# Patient Record
Sex: Female | Born: 1960
Health system: Southern US, Community
[De-identification: ages and names within clinical notes are randomized; demographics above are authoritative.]

## PROBLEM LIST (undated history)

## (undated) DIAGNOSIS — I10 Essential (primary) hypertension: Secondary | ICD-10-CM

## (undated) DIAGNOSIS — E785 Hyperlipidemia, unspecified: Secondary | ICD-10-CM

## (undated) DIAGNOSIS — T7840XA Allergy, unspecified, initial encounter: Secondary | ICD-10-CM

## (undated) HISTORY — DX: Allergy, unspecified, initial encounter: T78.40XA

## (undated) HISTORY — DX: Essential (primary) hypertension: I10

## (undated) HISTORY — PX: TUBAL LIGATION: SHX77

## (undated) HISTORY — DX: Hyperlipidemia, unspecified: E78.5

---

## 2005-02-21 ENCOUNTER — Ambulatory Visit: Payer: Self-pay | Admitting: Family Medicine

## 2006-04-21 ENCOUNTER — Ambulatory Visit: Payer: Self-pay | Admitting: Family Medicine

## 2007-04-23 ENCOUNTER — Ambulatory Visit: Payer: Self-pay | Admitting: Family Medicine

## 2007-07-25 ENCOUNTER — Ambulatory Visit: Payer: Self-pay | Admitting: Internal Medicine

## 2008-01-20 ENCOUNTER — Ambulatory Visit: Payer: Self-pay | Admitting: Gastroenterology

## 2008-07-21 ENCOUNTER — Ambulatory Visit: Payer: Self-pay | Admitting: Family Medicine

## 2009-09-18 ENCOUNTER — Ambulatory Visit: Payer: Self-pay | Admitting: Family Medicine

## 2010-05-28 ENCOUNTER — Ambulatory Visit: Payer: Self-pay | Admitting: Family Medicine

## 2013-06-08 DIAGNOSIS — L989 Disorder of the skin and subcutaneous tissue, unspecified: Secondary | ICD-10-CM | POA: Insufficient documentation

## 2014-11-09 ENCOUNTER — Ambulatory Visit: Payer: Self-pay | Admitting: Family Medicine

## 2015-04-14 ENCOUNTER — Encounter: Payer: Self-pay | Admitting: Family Medicine

## 2015-04-14 ENCOUNTER — Ambulatory Visit (INDEPENDENT_AMBULATORY_CARE_PROVIDER_SITE_OTHER): Payer: BLUE CROSS/BLUE SHIELD | Admitting: Family Medicine

## 2015-04-14 ENCOUNTER — Other Ambulatory Visit: Payer: Self-pay

## 2015-04-14 VITALS — BP 140/80 | HR 72 | Ht 62.0 in | Wt 157.0 lb

## 2015-04-14 DIAGNOSIS — J301 Allergic rhinitis due to pollen: Secondary | ICD-10-CM | POA: Diagnosis not present

## 2015-04-14 DIAGNOSIS — E663 Overweight: Secondary | ICD-10-CM

## 2015-04-14 DIAGNOSIS — E785 Hyperlipidemia, unspecified: Secondary | ICD-10-CM | POA: Diagnosis not present

## 2015-04-14 DIAGNOSIS — F17219 Nicotine dependence, cigarettes, with unspecified nicotine-induced disorders: Secondary | ICD-10-CM

## 2015-04-14 DIAGNOSIS — E7849 Other hyperlipidemia: Secondary | ICD-10-CM | POA: Insufficient documentation

## 2015-04-14 DIAGNOSIS — F17210 Nicotine dependence, cigarettes, uncomplicated: Secondary | ICD-10-CM | POA: Insufficient documentation

## 2015-04-14 DIAGNOSIS — I10 Essential (primary) hypertension: Secondary | ICD-10-CM | POA: Diagnosis not present

## 2015-04-14 DIAGNOSIS — Z8679 Personal history of other diseases of the circulatory system: Secondary | ICD-10-CM | POA: Insufficient documentation

## 2015-04-14 DIAGNOSIS — Z0000 Encounter for general adult medical examination without abnormal findings: Secondary | ICD-10-CM | POA: Insufficient documentation

## 2015-04-14 DIAGNOSIS — M5430 Sciatica, unspecified side: Secondary | ICD-10-CM | POA: Insufficient documentation

## 2015-04-14 MED ORDER — METOPROLOL SUCCINATE ER 25 MG PO TB24: 25.0000 mg | ORAL_TABLET | ORAL | Status: DC

## 2015-04-14 MED ORDER — ASPIRIN 81 MG PO TABS: 81.0000 mg | ORAL_TABLET | ORAL | Status: DC

## 2015-04-14 MED ORDER — OMEGA-3-ACID ETHYL ESTERS 1 G PO CAPS: 1.0000 g | ORAL_CAPSULE | Freq: Three times a day (TID) | ORAL | Status: DC

## 2015-04-14 MED ORDER — LOSARTAN POTASSIUM 50 MG PO TABS: 50.0000 mg | ORAL_TABLET | ORAL | Status: DC

## 2015-04-14 NOTE — Progress Notes (Signed)
Name: Carol Taylor   MRN: [id number]    DOB: Jan 23, 1961   Date:04/14/2015       Progress Note  Subjective  Chief Complaint  Chief Complaint  Patient presents with  . Hyperlipidemia  . Hypertension    Hyperlipidemia This is a chronic problem. The current episode started more than 1 year ago. The problem is controlled. Recent lipid tests were reviewed and are high. Exacerbating diseases include obesity. She has no history of diabetes, hypothyroidism, liver disease or nephrotic syndrome. Factors aggravating her hyperlipidemia include smoking. Pertinent negatives include no chest pain, focal sensory loss, focal weakness, leg pain, myalgias or shortness of breath. Current antihyperlipidemic treatment includes diet change (otc omega 3). The current treatment provides mild improvement of lipids. There are no compliance problems.  Risk factors for coronary artery disease include dyslipidemia and hypertension.  Hypertension This is a chronic problem. The current episode started more than 1 year ago. The problem has been gradually improving since onset. The problem is controlled. Associated symptoms include anxiety. Pertinent negatives include no blurred vision, chest pain, headaches, malaise/fatigue, neck pain, orthopnea, palpitations, peripheral edema or shortness of breath. There are no associated agents to hypertension. Risk factors for coronary artery disease include dyslipidemia, obesity and smoking/tobacco exposure. Past treatments include beta blockers and angiotensin blockers. The current treatment provides mild improvement. There are no compliance problems.  There is no history of angina, kidney disease, CAD/MI, CVA or heart failure.    No problem-specific assessment & plan notes found for this encounter.   Past Medical History  Diagnosis Date  . Hypertension   . Hyperlipidemia     Past Surgical History  Procedure Laterality Date  . Tubal ligation      Family History  Problem  Relation Age of Onset  . Cancer Father   . Cancer Sister     History   Social History  . Marital Status: Married    Spouse Name: N/A  . Number of Children: N/A  . Years of Education: N/A   Occupational History  . Not on file.   Social History Main Topics  . Smoking status: Current Every Day Smoker  . Smokeless tobacco: Not on file  . Alcohol Use: No  . Drug Use: No  . Sexual Activity: Not on file   Other Topics Concern  . Not on file   Social History Narrative  . No narrative on file    Allergies  Allergen Reactions  . Sulfa Antibiotics Hives     Review of Systems  Constitutional: Negative for malaise/fatigue.  HENT: Negative for sore throat.   Eyes: Negative for blurred vision.  Respiratory: Negative for cough and shortness of breath.   Cardiovascular: Negative for chest pain, palpitations and orthopnea.  Gastrointestinal: Negative for heartburn, nausea and abdominal pain.  Musculoskeletal: Negative for myalgias and neck pain.  Skin: Positive for rash.       Hx contact dermatitis  Neurological: Negative for dizziness, focal weakness and headaches.  Endo/Heme/Allergies: Does not bruise/bleed easily.  Psychiatric/Behavioral: Negative for depression.     Objective  Filed Vitals:   04/14/15 0918  BP: 140/80  Pulse: 72  Height:  (1.575 m)  Weight: 157 lb (71.215 kg)    Physical Exam  Constitutional: She is oriented to person, place, and time and well-developed, well-nourished, and in no distress.  HENT:  Head: Normocephalic.  Right Ear: External ear normal.  Left Ear: External ear normal.  Nose: Nose normal.  Mouth/Throat: Oropharynx is clear  and moist.  Eyes: Conjunctivae and EOM are normal. Pupils are equal, round, and reactive to light.  Neck: Normal range of motion. Neck supple. No thyromegaly present.  Cardiovascular: Normal rate, regular rhythm, normal heart sounds and intact distal pulses.   No murmur heard. Pulmonary/Chest: Effort  normal and breath sounds normal. No respiratory distress. She has no wheezes. She has no rales.  Abdominal: Soft. Bowel sounds are normal. She exhibits no distension.  Musculoskeletal: Normal range of motion.  Lymphadenopathy:    She has no cervical adenopathy.  Neurological: She is alert and oriented to person, place, and time.  Skin: Skin is warm and dry.  Psychiatric: Mood and affect normal.      No results found for this or any previous visit (from the past 2160 hour(s)).   Assessment & Plan  Problem List Items Addressed This Visit      Other   Nicotine addiction   Overweight    Other Visit Diagnoses    Essential hypertension    -  Primary    Relevant Medications    metoprolol succinate (TOPROL-XL) 25 MG 24 hr tablet    losartan (COZAAR) 50 MG tablet    aspirin 81 MG tablet    omega-3 acid ethyl esters (LOVAZA) 1 G capsule    Other Relevant Orders    Renal Function Panel    Hyperlipemia, idiopathic familial        Relevant Medications    omega-3 acid ethyl esters (LOVAZA) 1 G capsule    Other Relevant Orders    Lipid Profile    Allergic rhinitis due to pollen             Dr. Hayden Rasmusseneanna Jones [facility]  04/14/2015

## 2015-04-15 LAB — LIPID PANEL
Chol/HDL Ratio: 5.1 ratio units — ABNORMAL HIGH (ref 0.0–4.4)
Cholesterol, Total: 230 mg/dL — ABNORMAL HIGH (ref 100–199)
HDL: 45 mg/dL (ref >39)
LDL Calculated: 155 mg/dL — ABNORMAL HIGH (ref 0–99)
Triglycerides: 148 mg/dL (ref 0–149)
VLDL Cholesterol Cal: 30 mg/dL (ref 5–40)

## 2015-04-15 LAB — RENAL FUNCTION PANEL
Albumin: 4.3 g/dL (ref 3.5–5.5)
BUN/Creatinine Ratio: 11 (ref 9–23)
BUN: 7 mg/dL (ref 6–24)
CO2: 26 mmol/L (ref 18–29)
Calcium: 9.3 mg/dL (ref 8.7–10.2)
Chloride: 99 mmol/L (ref 97–108)
Creatinine, Ser: 0.65 mg/dL (ref 0.57–1.00)
GFR calc Af Amer: 116 mL/min/{1.73_m2} (ref >59)
GFR calc non Af Amer: 100 mL/min/{1.73_m2} (ref >59)
Glucose: 94 mg/dL (ref 65–99)
Phosphorus: 3.2 mg/dL (ref 2.5–4.5)
Potassium: 4.5 mmol/L (ref 3.5–5.2)
Sodium: 137 mmol/L (ref 134–144)

## 2015-04-17 ENCOUNTER — Telehealth: Payer: Self-pay

## 2015-04-17 NOTE — Telephone Encounter (Signed)
Called pt with labs results

## 2015-05-04 ENCOUNTER — Other Ambulatory Visit: Payer: Self-pay

## 2015-05-08 ENCOUNTER — Other Ambulatory Visit: Payer: Self-pay

## 2015-05-08 DIAGNOSIS — I10 Essential (primary) hypertension: Secondary | ICD-10-CM

## 2015-05-08 MED ORDER — LOSARTAN POTASSIUM 50 MG PO TABS: 50.0000 mg | ORAL_TABLET | Freq: Every day | ORAL | Status: DC

## 2015-05-08 MED ORDER — METOPROLOL SUCCINATE ER 25 MG PO TB24: 25.0000 mg | ORAL_TABLET | Freq: Every day | ORAL | Status: DC

## 2015-05-23 ENCOUNTER — Encounter: Payer: Self-pay | Admitting: Family Medicine

## 2015-05-23 ENCOUNTER — Ambulatory Visit (INDEPENDENT_AMBULATORY_CARE_PROVIDER_SITE_OTHER): Payer: BLUE CROSS/BLUE SHIELD | Admitting: Family Medicine

## 2015-05-23 VITALS — BP 150/90 | HR 60 | Temp 98.3°F | Ht 62.0 in | Wt 162.0 lb

## 2015-05-23 DIAGNOSIS — I10 Essential (primary) hypertension: Secondary | ICD-10-CM | POA: Diagnosis not present

## 2015-05-23 DIAGNOSIS — H8309 Labyrinthitis, unspecified ear: Secondary | ICD-10-CM

## 2015-05-23 MED ORDER — MECLIZINE HCL 32 MG PO TABS: 32.0000 mg | ORAL_TABLET | Freq: Three times a day (TID) | ORAL | Status: DC | PRN

## 2015-05-23 NOTE — Progress Notes (Signed)
Name: Mikayela Deats   MRN: [id number]    DOB: 1961-11-11   Date:05/23/2015       Progress Note  Subjective  Chief Complaint  Chief Complaint  Patient presents with  . Dizziness    gets worse when lying down- had an episode of flushing, felt like face was swollen last wednesday    Dizziness This is a new problem. The current episode started yesterday. The problem occurs constantly. The problem has been waxing and waning. Associated symptoms include chills, neck pain and vertigo. Pertinent negatives include no abdominal pain, anorexia, arthralgias, change in bowel habit, chest pain, congestion, coughing, diaphoresis, fatigue, fever, headaches, joint swelling, myalgias, nausea, numbness, rash, sore throat, swollen glands, urinary symptoms, visual change, vomiting or weakness. The symptoms are aggravated by smoking. She has tried nothing for the symptoms.    No problem-specific assessment & plan notes found for this encounter.   Past Medical History  Diagnosis Date  . Hypertension   . Hyperlipidemia     Past Surgical History  Procedure Laterality Date  . Tubal ligation      Family History  Problem Relation Age of Onset  . Cancer Father   . Cancer Sister     History   Social History  . Marital Status: Married    Spouse Name: N/A  . Number of Children: N/A  . Years of Education: N/A   Occupational History  . Not on file.   Social History Main Topics  . Smoking status: Current Every Day Smoker  . Smokeless tobacco: Not on file  . Alcohol Use: No  . Drug Use: No  . Sexual Activity: Yes   Other Topics Concern  . Not on file   Social History Narrative    Allergies  Allergen Reactions  . Sulfa Antibiotics Hives     Review of Systems  Constitutional: Positive for chills. Negative for fever, weight loss, malaise/fatigue, diaphoresis and fatigue.  HENT: Negative for congestion, ear discharge, ear pain, hearing loss, sore throat and tinnitus.   Eyes:  Negative for blurred vision and double vision.  Respiratory: Negative for cough, sputum production, shortness of breath and wheezing.   Cardiovascular: Negative for chest pain, palpitations and leg swelling.  Gastrointestinal: Negative for heartburn, nausea, vomiting, abdominal pain, diarrhea, constipation, blood in stool, melena, anorexia and change in bowel habit.  Genitourinary: Negative for dysuria, urgency, frequency and hematuria.  Musculoskeletal: Positive for neck pain. Negative for myalgias, back pain, joint pain, joint swelling and arthralgias.  Skin: Negative for rash.  Neurological: Positive for dizziness and vertigo. Negative for tingling, sensory change, focal weakness, weakness, numbness and headaches.       Vertigo-like dizziness  Endo/Heme/Allergies: Negative for environmental allergies and polydipsia. Does not bruise/bleed easily.  Psychiatric/Behavioral: Negative for depression and suicidal ideas. The patient is not nervous/anxious and does not have insomnia.      Objective  Filed Vitals:   05/23/15 0827  BP: 140/98  Pulse: 60  Height:  (1.575 m)  Weight: 162 lb (73.483 kg)    Physical Exam  Constitutional: She is well-developed, well-nourished, and in no distress. No distress.  HENT:  Head: Normocephalic and atraumatic.  Right Ear: Hearing, tympanic membrane, external ear and ear canal normal. Tympanic membrane is not retracted and not bulging.  Left Ear: Hearing, tympanic membrane, external ear and ear canal normal. Tympanic membrane is not retracted and not bulging.  Nose: Nose normal.  Mouth/Throat: Oropharynx is clear and moist.  Eyes: Conjunctivae and EOM  are normal. Pupils are equal, round, and reactive to light. Right eye exhibits no discharge. Left eye exhibits no discharge.  Neck: Normal range of motion. Neck supple. No JVD present. No thyromegaly present.  Cardiovascular: Normal rate, regular rhythm, S1 normal, S2 normal, normal heart sounds and  intact distal pulses.  Exam reveals no gallop and no friction rub.   No murmur heard. Pulmonary/Chest: Effort normal and breath sounds normal.  Abdominal: Soft. Bowel sounds are normal. She exhibits no mass. There is no tenderness. There is no guarding.  Musculoskeletal: Normal range of motion. She exhibits no edema.  Lymphadenopathy:    She has no cervical adenopathy.  Neurological: She is alert. She has normal sensation, normal strength, normal reflexes and intact cranial nerves. She is not disoriented. She displays normal speech. No cranial nerve deficit.  Skin: Skin is warm and dry. She is not diaphoretic.  Psychiatric: Mood and affect normal.      Assessment & Plan  Problem List Items Addressed This Visit    None    Visit Diagnoses    Labyrinthitis, unspecified laterality    -  Primary    Relevant Medications    meclizine (ANTIVERT) 32 MG tablet    Essential hypertension             Dr. Hayden Rasmusseneanna Luchiano Viscomi [facility]  05/23/2015

## 2015-05-23 NOTE — Patient Instructions (Signed)
Labyrinthitis (Inner Ear Inflammation) Your exam shows you have an inner ear disturbance or labyrinthitis. The cause of this condition is not known. But it may be due to a virus infection. The symptoms of labyrinthitis include vertigo or dizziness made worse by motion, nausea and vomiting. The onset of labyrinthitis may be very sudden. It usually lasts for a few days and then clears up over 1-2 weeks. The treatment of an inner ear disturbance includes bed rest and medications to reduce dizziness, nausea, and vomiting. You should stay away from alcohol, tranquilizers, caffeine, nicotine, or any medicine your doctor thinks may make your symptoms worse. Further testing may be needed to evaluate your hearing and balance system. Please see your doctor or go to the emergency room right away if you have:  Increasing vertigo, earache, loss of hearing, or ear drainage.  Headache, blurred vision, trouble walking, fainting, or fever.  Persistent vomiting, dehydration, or extreme weakness. Document Released: 10/28/2005 Document Revised: 01/20/2012 Document Reviewed: 04/15/2007 Exit[employer] Patient Information 2015 [employer]. This information is not intended to replace advice given to you by your health care provider. Make sure you discuss any questions you have with your health care provider.  

## 2015-10-18 ENCOUNTER — Ambulatory Visit (INDEPENDENT_AMBULATORY_CARE_PROVIDER_SITE_OTHER): Payer: BLUE CROSS/BLUE SHIELD | Admitting: Family Medicine

## 2015-10-18 ENCOUNTER — Encounter: Payer: Self-pay | Admitting: Family Medicine

## 2015-10-18 VITALS — BP 130/80 | HR 72 | Ht 62.0 in | Wt 165.0 lb

## 2015-10-18 DIAGNOSIS — I10 Essential (primary) hypertension: Secondary | ICD-10-CM | POA: Diagnosis not present

## 2015-10-18 DIAGNOSIS — Z23 Encounter for immunization: Secondary | ICD-10-CM | POA: Diagnosis not present

## 2015-10-18 DIAGNOSIS — E785 Hyperlipidemia, unspecified: Secondary | ICD-10-CM

## 2015-10-18 DIAGNOSIS — E663 Overweight: Secondary | ICD-10-CM | POA: Diagnosis not present

## 2015-10-18 MED ORDER — LOSARTAN POTASSIUM 50 MG PO TABS: 50.0000 mg | ORAL_TABLET | Freq: Every day | ORAL | Status: DC

## 2015-10-18 MED ORDER — METOPROLOL SUCCINATE ER 25 MG PO TB24: 25.0000 mg | ORAL_TABLET | Freq: Every day | ORAL | Status: DC

## 2015-10-18 MED ORDER — OMEGA-3-ACID ETHYL ESTERS 1 G PO CAPS: 1.0000 g | ORAL_CAPSULE | Freq: Two times a day (BID) | ORAL | Status: DC

## 2015-10-18 NOTE — Patient Instructions (Signed)

## 2015-10-18 NOTE — Progress Notes (Signed)
Name: Carol HeldHilda Moore Taylor   MRN: [id number]    DOB: 1961-06-28   Date:10/18/2015       Progress Note  Subjective  Chief Complaint  Chief Complaint  Patient presents with  . Hypertension  . Allergic Rhinitis     Hypertension This is a chronic problem. The current episode started more than 1 year ago. The problem has been waxing and waning since onset. The problem is controlled. Pertinent negatives include no anxiety, blurred vision, chest pain, headaches, malaise/fatigue, neck pain, orthopnea, palpitations, peripheral edema, PND, shortness of breath or sweats. There are no associated agents to hypertension. Risk factors for coronary artery disease include smoking/tobacco exposure. Past treatments include beta blockers and angiotensin blockers. The current treatment provides moderate improvement. There are no compliance problems.  There is no history of angina, kidney disease, CAD/MI, CVA, heart failure, left ventricular hypertrophy, PVD, renovascular disease or retinopathy. There is no history of chronic renal disease or a hypertension causing med.  Hyperlipidemia This is a chronic problem. The current episode started more than 1 year ago. The problem is controlled. Recent lipid tests were reviewed and are variable. She has no history of chronic renal disease, diabetes, hypothyroidism, liver disease, obesity or nephrotic syndrome. There are no known factors aggravating her hyperlipidemia. Pertinent negatives include no chest pain, focal sensory loss, focal weakness, leg pain, myalgias or shortness of breath. Current antihyperlipidemic treatment includes diet change (omega 3). Risk factors for coronary artery disease include obesity and dyslipidemia.    No problem-specific assessment & plan notes found for this encounter.   Past Medical History  Diagnosis Date  . Hypertension   . Hyperlipidemia     Past Surgical History  Procedure Laterality Date  . Tubal ligation      Family History   Problem Relation Age of Onset  . Cancer Father   . Cancer Sister     Social History   Social History  . Marital Status: Married    Spouse Name: N/A  . Number of Children: N/A  . Years of Education: N/A   Occupational History  . Not on file.   Social History Main Topics  . Smoking status: Current Every Day Smoker  . Smokeless tobacco: Not on file  . Alcohol Use: No  . Drug Use: No  . Sexual Activity: Yes   Other Topics Concern  . Not on file   Social History Narrative    Allergies  Allergen Reactions  . Sulfa Antibiotics Hives     Review of Systems  Constitutional: Negative for fever, chills, weight loss and malaise/fatigue.  HENT: Negative for ear discharge, ear pain and sore throat.   Eyes: Negative for blurred vision.  Respiratory: Negative for cough, sputum production, shortness of breath and wheezing.   Cardiovascular: Negative for chest pain, palpitations, orthopnea, leg swelling and PND.  Gastrointestinal: Negative for heartburn, nausea, abdominal pain, diarrhea, constipation, blood in stool and melena.  Genitourinary: Negative for dysuria, urgency, frequency and hematuria.  Musculoskeletal: Negative for myalgias, back pain, joint pain and neck pain.  Skin: Negative for rash.  Neurological: Negative for dizziness, tingling, sensory change, focal weakness and headaches.  Endo/Heme/Allergies: Negative for environmental allergies and polydipsia. Does not bruise/bleed easily.  Psychiatric/Behavioral: Negative for depression and suicidal ideas. The patient is not nervous/anxious and does not have insomnia.      Objective  Filed Vitals:   10/18/15 0932  BP: 130/80  Pulse: 72  Height: 5\' 2"  (1.575 m)  Weight: 165 lb (74.844 kg)  Physical Exam  Constitutional: She is well-developed, well-nourished, and in no distress. No distress.  HENT:  Head: Normocephalic and atraumatic.  Right Ear: External ear normal.  Left Ear: External ear normal.  Nose:  Nose normal.  Mouth/Throat: Oropharynx is clear and moist.  Eyes: Conjunctivae and EOM are normal. Pupils are equal, round, and reactive to light. Right eye exhibits no discharge. Left eye exhibits no discharge.  Neck: Normal range of motion. Neck supple. No JVD present. No thyromegaly present.  Cardiovascular: Normal rate, regular rhythm, normal heart sounds and intact distal pulses.  Exam reveals no gallop and no friction rub.   No murmur heard. Pulmonary/Chest: Effort normal and breath sounds normal.  Abdominal: Soft. Bowel sounds are normal. She exhibits no mass. There is no tenderness. There is no guarding.  Musculoskeletal: Normal range of motion. She exhibits no edema.  Lymphadenopathy:    She has no cervical adenopathy.  Neurological: She is alert.  Skin: Skin is warm and dry. She is not diaphoretic.  Psychiatric: Mood and affect normal.  Nursing note and vitals reviewed.     Assessment & Plan  Problem List Items Addressed This Visit      Cardiovascular and Mediastinum   Essential hypertension - Primary   Relevant Medications   losartan (COZAAR) 50 MG tablet   metoprolol succinate (TOPROL-XL) 25 MG 24 hr tablet   omega-3 acid ethyl esters (LOVAZA) 1 G capsule   Other Relevant Orders   Renal Function Panel     Other   Overweight    Other Visit Diagnoses    Hyperlipidemia        Relevant Medications    losartan (COZAAR) 50 MG tablet    metoprolol succinate (TOPROL-XL) 25 MG 24 hr tablet    omega-3 acid ethyl esters (LOVAZA) 1 G capsule    Other Relevant Orders    Lipid Profile    Hyperlipemia, idiopathic familial        Flu vaccine need        Relevant Orders    Flu Vaccine QUAD 36+ mos PF IM (Fluarix & Fluzone Quad PF) (Completed)         Dr. Hayden [facility]  10/18/2015

## 2015-10-19 LAB — LIPID PANEL
Chol/HDL Ratio: 4.7 ratio units — ABNORMAL HIGH (ref 0.0–4.4)
Cholesterol, Total: 232 mg/dL — ABNORMAL HIGH (ref 100–199)
HDL: 49 mg/dL (ref >39)
LDL Calculated: 155 mg/dL — ABNORMAL HIGH (ref 0–99)
Triglycerides: 139 mg/dL (ref 0–149)
VLDL Cholesterol Cal: 28 mg/dL (ref 5–40)

## 2015-10-19 LAB — RENAL FUNCTION PANEL
Albumin: 4.2 g/dL (ref 3.5–5.5)
BUN/Creatinine Ratio: 13 (ref 9–23)
BUN: 9 mg/dL (ref 6–24)
CO2: 26 mmol/L (ref 18–29)
Calcium: 8.9 mg/dL (ref 8.7–10.2)
Chloride: 99 mmol/L (ref 97–106)
Creatinine, Ser: 0.68 mg/dL (ref 0.57–1.00)
GFR calc Af Amer: 113 mL/min/{1.73_m2} (ref >59)
GFR calc non Af Amer: 98 mL/min/{1.73_m2} (ref >59)
Glucose: 88 mg/dL (ref 65–99)
Phosphorus: 3.2 mg/dL (ref 2.5–4.5)
Potassium: 4.1 mmol/L (ref 3.5–5.2)
Sodium: 140 mmol/L (ref 136–144)

## 2015-10-20 ENCOUNTER — Other Ambulatory Visit: Payer: Self-pay

## 2015-10-24 ENCOUNTER — Other Ambulatory Visit: Payer: Self-pay | Admitting: Family Medicine

## 2015-12-31 ENCOUNTER — Other Ambulatory Visit: Payer: Self-pay | Admitting: Family Medicine

## 2016-02-08 ENCOUNTER — Other Ambulatory Visit: Payer: Self-pay

## 2016-03-02 ENCOUNTER — Other Ambulatory Visit: Payer: Self-pay | Admitting: Family Medicine

## 2016-04-01 ENCOUNTER — Other Ambulatory Visit: Payer: Self-pay | Admitting: Family Medicine

## 2016-04-12 ENCOUNTER — Ambulatory Visit (INDEPENDENT_AMBULATORY_CARE_PROVIDER_SITE_OTHER): Payer: BLUE CROSS/BLUE SHIELD | Admitting: Family Medicine

## 2016-04-12 ENCOUNTER — Encounter: Payer: Self-pay | Admitting: Family Medicine

## 2016-04-12 VITALS — BP 120/70 | HR 70 | Ht 62.0 in | Wt 165.0 lb

## 2016-04-12 DIAGNOSIS — Z23 Encounter for immunization: Secondary | ICD-10-CM

## 2016-04-12 DIAGNOSIS — E663 Overweight: Secondary | ICD-10-CM | POA: Diagnosis not present

## 2016-04-12 DIAGNOSIS — E785 Hyperlipidemia, unspecified: Secondary | ICD-10-CM

## 2016-04-12 DIAGNOSIS — I10 Essential (primary) hypertension: Secondary | ICD-10-CM

## 2016-04-12 DIAGNOSIS — Z8679 Personal history of other diseases of the circulatory system: Secondary | ICD-10-CM

## 2016-04-12 DIAGNOSIS — F17219 Nicotine dependence, cigarettes, with unspecified nicotine-induced disorders: Secondary | ICD-10-CM

## 2016-04-12 MED ORDER — METOPROLOL SUCCINATE ER 25 MG PO TB24: 25.0000 mg | ORAL_TABLET | Freq: Every day | ORAL | Status: DC

## 2016-04-12 MED ORDER — LOSARTAN POTASSIUM 50 MG PO TABS: 50.0000 mg | ORAL_TABLET | Freq: Every day | ORAL | Status: DC

## 2016-04-12 NOTE — Progress Notes (Signed)
Name: Carol HeldHilda Moore Taylor   MRN: [id number]    DOB: 1961-03-08   Date:04/12/2016       Progress Note  Subjective  Chief Complaint  Chief Complaint  Patient presents with  . Hypertension    Hypertension This is a chronic problem. The current episode started more than 1 year ago. The problem has been gradually improving since onset. The problem is controlled. Pertinent negatives include no anxiety, blurred vision, chest pain, headaches, malaise/fatigue, neck pain, orthopnea, palpitations, peripheral edema, PND, shortness of breath or sweats. There are no associated agents to hypertension. There are no known risk factors for coronary artery disease. Past treatments include angiotensin blockers and beta blockers. The current treatment provides moderate improvement. There are no compliance problems.  There is no history of angina, kidney disease, CAD/MI, CVA, heart failure, left ventricular hypertrophy, PVD, renovascular disease or retinopathy. There is no history of chronic renal disease or a hypertension causing med.    No problem-specific assessment & plan notes found for this encounter.   Past Medical History  Diagnosis Date  . Hypertension   . Hyperlipidemia     Past Surgical History  Procedure Laterality Date  . Tubal ligation      Family History  Problem Relation Age of Onset  . Cancer Father   . Cancer Sister     Social History   Social History  . Marital Status: Married    Spouse Name: N/A  . Number of Children: N/A  . Years of Education: N/A   Occupational History  . Not on file.   Social History Main Topics  . Smoking status: Current Every Day Smoker  . Smokeless tobacco: Not on file  . Alcohol Use: No  . Drug Use: No  . Sexual Activity: Yes   Other Topics Concern  . Not on file   Social History Narrative    Allergies  Allergen Reactions  . Sulfa Antibiotics Hives     Review of Systems  Constitutional: Negative for fever, chills, weight loss and  malaise/fatigue.  HENT: Negative for ear discharge, ear pain and sore throat.   Eyes: Negative for blurred vision.  Respiratory: Negative for cough, sputum production, shortness of breath and wheezing.   Cardiovascular: Negative for chest pain, palpitations, orthopnea, leg swelling and PND.  Gastrointestinal: Negative for heartburn, nausea, abdominal pain, diarrhea, constipation, blood in stool and melena.  Genitourinary: Negative for dysuria, urgency, frequency and hematuria.  Musculoskeletal: Negative for myalgias, back pain, joint pain and neck pain.  Skin: Negative for rash.  Neurological: Negative for dizziness, tingling, sensory change, focal weakness and headaches.  Endo/Heme/Allergies: Negative for environmental allergies and polydipsia. Does not bruise/bleed easily.  Psychiatric/Behavioral: Negative for depression and suicidal ideas. The patient is not nervous/anxious and does not have insomnia.      Objective  Filed Vitals:   04/12/16 0842  BP: 120/70  Pulse: 70  Height: 5\' 2"  (1.575 m)  Weight: 165 lb (74.844 kg)    Physical Exam  Constitutional: She is well-developed, well-nourished, and in no distress. No distress.  HENT:  Head: Normocephalic and atraumatic.  Right Ear: External ear normal.  Left Ear: External ear normal.  Nose: Nose normal.  Mouth/Throat: Oropharynx is clear and moist.  Eyes: Conjunctivae and EOM are normal. Pupils are equal, round, and reactive to light. Right eye exhibits no discharge. Left eye exhibits no discharge.  Neck: Normal range of motion. Neck supple. No JVD present. No thyromegaly present.  Cardiovascular: Normal rate, regular rhythm, normal  heart sounds and intact distal pulses.  Exam reveals no gallop and no friction rub.   No murmur heard. Pulmonary/Chest: Effort normal and breath sounds normal.  Abdominal: Soft. Bowel sounds are normal. She exhibits no mass. There is no tenderness. There is no guarding.  Musculoskeletal: Normal  range of motion. She exhibits no edema.  Lymphadenopathy:    She has no cervical adenopathy.  Neurological: She is alert.  Skin: Skin is warm and dry. She is not diaphoretic.  Psychiatric: Mood and affect normal.  Nursing note and vitals reviewed.     Assessment & Plan  Problem List Items Addressed This Visit      Cardiovascular and Mediastinum   Essential hypertension - Primary   Relevant Medications   losartan (COZAAR) 50 MG tablet   metoprolol succinate (TOPROL-XL) 25 MG 24 hr tablet   Other Relevant Orders   Renal Function Panel     Other   H/O: HTN (hypertension)   Nicotine addiction   Overweight    Other Visit Diagnoses    Hyperlipidemia        Relevant Medications    losartan (COZAAR) 50 MG tablet    metoprolol succinate (TOPROL-XL) 25 MG 24 hr tablet    Other Relevant Orders    Lipid Profile    Need for Tdap vaccination        Relevant Orders    Tdap vaccine greater than or equal to 7yo IM (Completed)         Dr. Hayden [facility]  04/12/2016

## 2016-04-13 LAB — RENAL FUNCTION PANEL
Albumin: 4.2 g/dL (ref 3.5–5.5)
BUN/Creatinine Ratio: 10 (ref 9–23)
BUN: 6 mg/dL (ref 6–24)
CO2: 23 mmol/L (ref 18–29)
Calcium: 8.8 mg/dL (ref 8.7–10.2)
Chloride: 100 mmol/L (ref 96–106)
Creatinine, Ser: 0.62 mg/dL (ref 0.57–1.00)
GFR calc Af Amer: 117 mL/min/{1.73_m2} (ref >59)
GFR calc non Af Amer: 101 mL/min/{1.73_m2} (ref >59)
Glucose: 83 mg/dL (ref 65–99)
Phosphorus: 2.8 mg/dL (ref 2.5–4.5)
Potassium: 4.1 mmol/L (ref 3.5–5.2)
Sodium: 139 mmol/L (ref 134–144)

## 2016-04-13 LAB — LIPID PANEL
Chol/HDL Ratio: 4.4 ratio units (ref 0.0–4.4)
Cholesterol, Total: 224 mg/dL — ABNORMAL HIGH (ref 100–199)
HDL: 51 mg/dL (ref >39)
LDL Calculated: 155 mg/dL — ABNORMAL HIGH (ref 0–99)
Triglycerides: 91 mg/dL (ref 0–149)
VLDL Cholesterol Cal: 18 mg/dL (ref 5–40)

## 2016-04-17 ENCOUNTER — Other Ambulatory Visit: Payer: Self-pay

## 2016-05-16 ENCOUNTER — Other Ambulatory Visit: Payer: Self-pay | Admitting: Family Medicine

## 2016-07-09 ENCOUNTER — Ambulatory Visit (INDEPENDENT_AMBULATORY_CARE_PROVIDER_SITE_OTHER): Payer: BLUE CROSS/BLUE SHIELD | Admitting: Family Medicine

## 2016-07-09 ENCOUNTER — Encounter: Payer: Self-pay | Admitting: Family Medicine

## 2016-07-09 VITALS — BP 138/80 | HR 78 | Ht 62.0 in | Wt 162.0 lb

## 2016-07-09 DIAGNOSIS — W57XXXA Bitten or stung by nonvenomous insect and other nonvenomous arthropods, initial encounter: Secondary | ICD-10-CM

## 2016-07-09 DIAGNOSIS — T148 Other injury of unspecified body region: Secondary | ICD-10-CM

## 2016-07-09 MED ORDER — TRIAMCINOLONE ACETONIDE 0.1 % EX CREA: 1.0000 "application " | TOPICAL_CREAM | Freq: Two times a day (BID) | CUTANEOUS | 0 refills | Status: DC

## 2016-07-09 MED ORDER — TRIAMCINOLONE ACETONIDE 0.1 % EX CREA
1.0000 | TOPICAL_CREAM | Freq: Two times a day (BID) | CUTANEOUS | 0 refills | Status: DC
Start: 2016-07-09 — End: 2016-10-01

## 2016-07-09 NOTE — Progress Notes (Signed)
Name: Carol HeldHilda Moore Taylor   MRN: [id number]    DOB: 1961/08/18   Date:07/09/2016       Progress Note  Subjective  Chief Complaint  Chief Complaint  Patient presents with  . Rash    3 bumps came up on body after grandson was Dx with chicken pox    Rash  This is a new problem. The current episode started in the past 7 days. The problem has been gradually improving since onset. The affected locations include the chest and neck. The rash is characterized by itchiness. She was exposed to nothing. Pertinent negatives include no anorexia, congestion, cough, diarrhea, eye pain, facial edema, fatigue, fever, joint pain, nail changes, rhinorrhea, shortness of breath, sore throat or vomiting. The treatment provided mild relief.    No problem-specific Assessment & Plan notes found for this encounter.   Past Medical History:  Diagnosis Date  . Hyperlipidemia   . Hypertension     Past Surgical History:  Procedure Laterality Date  . TUBAL LIGATION      Family History  Problem Relation Age of Onset  . Cancer Father   . Cancer Sister     Social History   Social History  . Marital status: Married    Spouse name: N/A  . Number of children: N/A  . Years of education: N/A   Occupational History  . Not on file.   Social History Main Topics  . Smoking status: Current Every Day Smoker  . Smokeless tobacco: Not on file  . Alcohol use No  . Drug use: No  . Sexual activity: Yes   Other Topics Concern  . Not on file   Social History Narrative  . No narrative on file    Allergies  Allergen Reactions  . Sulfa Antibiotics Hives     Review of Systems  Constitutional: Negative for chills, fatigue, fever, malaise/fatigue and weight loss.  HENT: Negative for congestion, ear discharge, ear pain, rhinorrhea and sore throat.   Eyes: Negative for blurred vision and pain.  Respiratory: Negative for cough, sputum production, shortness of breath and wheezing.   Cardiovascular: Negative  for chest pain, palpitations and leg swelling.  Gastrointestinal: Negative for abdominal pain, anorexia, blood in stool, constipation, diarrhea, heartburn, melena, nausea and vomiting.  Genitourinary: Negative for dysuria, frequency, hematuria and urgency.  Musculoskeletal: Negative for back pain, joint pain, myalgias and neck pain.  Skin: Positive for rash. Negative for nail changes.  Neurological: Negative for dizziness, tingling, sensory change, focal weakness and headaches.  Endo/Heme/Allergies: Negative for environmental allergies and polydipsia. Does not bruise/bleed easily.  Psychiatric/Behavioral: Negative for depression and suicidal ideas. The patient is not nervous/anxious and does not have insomnia.      Objective  Vitals:   07/09/16 1333  BP: 138/80  Pulse: 78  Weight: 162 lb (73.5 kg)  Height: 5\' 2"  (1.575 m)    Physical Exam  Constitutional: She is well-developed, well-nourished, and in no distress. No distress.  HENT:  Head: Normocephalic and atraumatic.  Right Ear: External ear normal.  Left Ear: External ear normal.  Nose: Nose normal.  Mouth/Throat: Oropharynx is clear and moist.  Eyes: Conjunctivae and EOM are normal. Pupils are equal, round, and reactive to light. Right eye exhibits no discharge. Left eye exhibits no discharge.  Neck: Normal range of motion. Neck supple. No JVD present. No thyromegaly present.  Cardiovascular: Normal rate, regular rhythm, normal heart sounds and intact distal pulses.  Exam reveals no gallop and no friction rub.  No murmur heard. Pulmonary/Chest: Effort normal and breath sounds normal.  Abdominal: Soft. Bowel sounds are normal. She exhibits no mass. There is no tenderness. There is no guarding.  Musculoskeletal: Normal range of motion. She exhibits no edema.  Lymphadenopathy:    She has no cervical adenopathy.  Neurological: She is alert.  Skin: Skin is warm and dry. She is not diaphoretic.  Psychiatric: Mood and affect  normal.  Nursing note and vitals reviewed.     Assessment & Plan  Problem List Items Addressed This Visit    None    Visit Diagnoses    Insect bite    -  Primary   Relevant Medications   triamcinolone cream (KENALOG) 0.1 %        Dr. Hayden [facility]  07/09/16

## 2016-08-26 ENCOUNTER — Other Ambulatory Visit: Payer: Self-pay | Admitting: Family Medicine

## 2016-08-26 DIAGNOSIS — I10 Essential (primary) hypertension: Secondary | ICD-10-CM

## 2016-09-13 ENCOUNTER — Encounter: Payer: BLUE CROSS/BLUE SHIELD | Admitting: Family Medicine

## 2016-10-01 ENCOUNTER — Encounter: Payer: Self-pay | Admitting: Family Medicine

## 2016-10-01 ENCOUNTER — Ambulatory Visit (INDEPENDENT_AMBULATORY_CARE_PROVIDER_SITE_OTHER): Payer: BLUE CROSS/BLUE SHIELD | Admitting: Family Medicine

## 2016-10-01 VITALS — BP 130/90 | HR 74 | Ht 62.0 in | Wt 163.0 lb

## 2016-10-01 DIAGNOSIS — J0100 Acute maxillary sinusitis, unspecified: Secondary | ICD-10-CM

## 2016-10-01 DIAGNOSIS — F17210 Nicotine dependence, cigarettes, uncomplicated: Secondary | ICD-10-CM

## 2016-10-01 DIAGNOSIS — E7849 Other hyperlipidemia: Secondary | ICD-10-CM

## 2016-10-01 DIAGNOSIS — E784 Other hyperlipidemia: Secondary | ICD-10-CM

## 2016-10-01 DIAGNOSIS — I10 Essential (primary) hypertension: Secondary | ICD-10-CM

## 2016-10-01 DIAGNOSIS — J301 Allergic rhinitis due to pollen: Secondary | ICD-10-CM

## 2016-10-01 DIAGNOSIS — E663 Overweight: Secondary | ICD-10-CM | POA: Diagnosis not present

## 2016-10-01 DIAGNOSIS — E7800 Pure hypercholesterolemia, unspecified: Secondary | ICD-10-CM | POA: Diagnosis not present

## 2016-10-01 DIAGNOSIS — Z23 Encounter for immunization: Secondary | ICD-10-CM | POA: Diagnosis not present

## 2016-10-01 MED ORDER — LOSARTAN POTASSIUM 50 MG PO TABS: 50.0000 mg | ORAL_TABLET | Freq: Every day | ORAL | 1 refills | Status: DC

## 2016-10-01 MED ORDER — METOPROLOL SUCCINATE ER 25 MG PO TB24: 25.0000 mg | ORAL_TABLET | Freq: Every day | ORAL | 1 refills | Status: DC

## 2016-10-01 MED ORDER — AZITHROMYCIN 250 MG PO TABS: ORAL_TABLET | ORAL | 0 refills | Status: DC

## 2016-10-01 MED ORDER — OMEGA-3-ACID ETHYL ESTERS 1 G PO CAPS: 1.0000 g | ORAL_CAPSULE | Freq: Two times a day (BID) | ORAL | 11 refills | Status: DC

## 2016-10-01 NOTE — Progress Notes (Signed)
Name: Carol Taylor   MRN: [id number]    DOB: [birth year]   Date:10/01/2016       Progress Note  Subjective  Chief Complaint  Chief Complaint  Patient presents with  . Allergic Rhinitis   . Hyperlipidemia  . Hypertension    Hyperlipidemia  This is a chronic problem. The current episode started more than 1 year ago. The problem is controlled. Recent lipid tests were reviewed and are normal. She has no history of chronic renal disease, diabetes, hypothyroidism, liver disease, obesity or nephrotic syndrome. Pertinent negatives include no chest pain, focal sensory loss, focal weakness, leg pain, myalgias or shortness of breath. Treatments tried: omega 3. The current treatment provides mild improvement of lipids. There are no compliance problems.  Risk factors for coronary artery disease include hypertension and dyslipidemia.  Hypertension  This is a chronic problem. The current episode started more than 1 year ago. The problem has been waxing and waning since onset. The problem is controlled. Pertinent negatives include no anxiety, blurred vision, chest pain, headaches, malaise/fatigue, neck pain, orthopnea, palpitations, peripheral edema, PND, shortness of breath or sweats. There are no associated agents to hypertension. Risk factors for coronary artery disease include smoking/tobacco exposure. Past treatments include angiotensin blockers and beta blockers. The current treatment provides moderate improvement. There are no compliance problems.  There is no history of angina, kidney disease, CAD/MI, CVA, heart failure, left ventricular hypertrophy, PVD, renovascular disease or retinopathy. There is no history of chronic renal disease or a hypertension causing med.  Sinusitis  This is a chronic problem. The current episode started in the past 7 days. The problem has been waxing and waning since onset. There has been no fever. Associated symptoms include congestion, sinus pressure and sneezing.  Pertinent negatives include no chills, coughing, diaphoresis, ear pain, headaches, hoarse voice, neck pain, shortness of breath or sore throat. Past treatments include saline sprays. The treatment provided mild relief.    No problem-specific Assessment & Plan notes found for this encounter.   Past Medical History:  Diagnosis Date  . Allergy   . Hyperlipidemia   . Hypertension     Past Surgical History:  Procedure Laterality Date  . TUBAL LIGATION      Family History  Problem Relation Age of Onset  . Cancer Father   . Cancer Sister     Social History   Social History  . Marital status: Married    Spouse name: N/A  . Number of children: N/A  . Years of education: N/A   Occupational History  . Not on file.   Social History Main Topics  . Smoking status: Current Every Day Smoker  . Smokeless tobacco: Not on file  . Alcohol use No  . Drug use: No  . Sexual activity: Yes   Other Topics Concern  . Not on file   Social History Narrative  . No narrative on file    Allergies  Allergen Reactions  . Sulfa Antibiotics Hives     Review of Systems  Constitutional: Negative for chills, diaphoresis, fever, malaise/fatigue and weight loss.  HENT: Positive for congestion, sinus pressure and sneezing. Negative for ear discharge, ear pain, hoarse voice and sore throat.   Eyes: Negative for blurred vision.  Respiratory: Negative for cough, sputum production, shortness of breath and wheezing.   Cardiovascular: Negative for chest pain, palpitations, orthopnea, leg swelling and PND.  Gastrointestinal: Negative for abdominal pain, blood in stool, constipation, diarrhea, heartburn, melena and nausea.  Genitourinary: Negative  for dysuria, frequency, hematuria and urgency.  Musculoskeletal: Negative for back pain, joint pain, myalgias and neck pain.  Skin: Negative for rash.  Neurological: Negative for dizziness, tingling, sensory change, focal weakness and headaches.   Endo/Heme/Allergies: Negative for environmental allergies and polydipsia. Does not bruise/bleed easily.  Psychiatric/Behavioral: Negative for depression and suicidal ideas. The patient is not nervous/anxious and does not have insomnia.      Objective  Vitals:   10/01/16 0815  BP: 130/90  Pulse: 74  Weight: 163 lb (73.9 kg)  Height: 5\' 2"  (1.575 m)    Physical Exam  Constitutional: She is well-developed, well-nourished, and in no distress. No distress.  HENT:  Head: Normocephalic and atraumatic.  Right Ear: External ear normal.  Left Ear: External ear normal.  Nose: Nose normal.  Mouth/Throat: Oropharynx is clear and moist.  Eyes: Conjunctivae and EOM are normal. Pupils are equal, round, and reactive to light. Right eye exhibits no discharge. Left eye exhibits no discharge.  Neck: Normal range of motion. Neck supple. No JVD present. No thyromegaly present.  Cardiovascular: Normal rate, regular rhythm, normal heart sounds and intact distal pulses.  Exam reveals no gallop and no friction rub.   No murmur heard. Pulmonary/Chest: Effort normal and breath sounds normal.  Abdominal: Soft. Bowel sounds are normal. She exhibits no mass. There is no tenderness. There is no guarding.  Musculoskeletal: Normal range of motion. She exhibits no edema.  Lymphadenopathy:    She has no cervical adenopathy.  Neurological: She is alert. She has normal reflexes.  Skin: Skin is warm and dry. She is not diaphoretic.  Psychiatric: Mood and affect normal.  Nursing note and vitals reviewed.     Assessment & Plan  Problem List Items Addressed This Visit      Cardiovascular and Mediastinum   Essential hypertension - Primary   Relevant Medications   losartan (COZAAR) 50 MG tablet   metoprolol succinate (TOPROL-XL) 25 MG 24 hr tablet   omega-3 acid ethyl esters (LOVAZA) 1 g capsule   Other Relevant Orders   Renal Function Panel     Respiratory   Chronic seasonal allergic rhinitis due to  pollen     Other   Familial multiple lipoprotein-type hyperlipidemia   Relevant Medications   losartan (COZAAR) 50 MG tablet   metoprolol succinate (TOPROL-XL) 25 MG 24 hr tablet   omega-3 acid ethyl esters (LOVAZA) 1 g capsule   Other Relevant Orders   Lipid Profile   Cigarette nicotine dependence without complication   Overweight (BMI 25.0-29.9)    Other Visit Diagnoses    Acute non-recurrent maxillary sinusitis       Relevant Medications   azithromycin (ZITHROMAX) 250 MG tablet   Pure hypercholesterolemia       Relevant Medications   losartan (COZAAR) 50 MG tablet   metoprolol succinate (TOPROL-XL) 25 MG 24 hr tablet   omega-3 acid ethyl esters (LOVAZA) 1 g capsule   Flu vaccine need       Relevant Orders   Flu Vaccine QUAD 36+ mos PF IM (Fluarix & Fluzone Quad PF) (Completed)        Dr. Hayden Rasmusseneanna Jones [facility]  10/01/16

## 2016-10-02 ENCOUNTER — Other Ambulatory Visit: Payer: Self-pay

## 2016-10-02 LAB — RENAL FUNCTION PANEL
Albumin: 4.4 g/dL (ref 3.5–5.5)
BUN/Creatinine Ratio: 11 (ref 9–23)
BUN: 7 mg/dL (ref 6–24)
CO2: 26 mmol/L (ref 18–29)
Calcium: 9.3 mg/dL (ref 8.7–10.2)
Chloride: 101 mmol/L (ref 96–106)
Creatinine, Ser: 0.66 mg/dL (ref 0.57–1.00)
GFR calc Af Amer: 113 mL/min/{1.73_m2} (ref >59)
GFR calc non Af Amer: 98 mL/min/{1.73_m2} (ref >59)
Glucose: 96 mg/dL (ref 65–99)
Phosphorus: 3.1 mg/dL (ref 2.5–4.5)
Potassium: 4.6 mmol/L (ref 3.5–5.2)
Sodium: 140 mmol/L (ref 134–144)

## 2016-10-02 LAB — LIPID PANEL
Chol/HDL Ratio: 5.8 ratio units — ABNORMAL HIGH (ref 0.0–4.4)
Cholesterol, Total: 237 mg/dL — ABNORMAL HIGH (ref 100–199)
HDL: 41 mg/dL (ref >39)
LDL Calculated: 157 mg/dL — ABNORMAL HIGH (ref 0–99)
Triglycerides: 194 mg/dL — ABNORMAL HIGH (ref 0–149)
VLDL Cholesterol Cal: 39 mg/dL (ref 5–40)

## 2016-11-18 ENCOUNTER — Other Ambulatory Visit: Payer: Self-pay | Admitting: Family Medicine

## 2016-11-26 ENCOUNTER — Encounter: Payer: BLUE CROSS/BLUE SHIELD | Admitting: Family Medicine

## 2016-12-09 ENCOUNTER — Encounter: Payer: BLUE CROSS/BLUE SHIELD | Admitting: Family Medicine

## 2017-05-24 ENCOUNTER — Other Ambulatory Visit: Payer: Self-pay | Admitting: Family Medicine

## 2017-05-24 DIAGNOSIS — I10 Essential (primary) hypertension: Secondary | ICD-10-CM

## 2017-05-25 ENCOUNTER — Other Ambulatory Visit: Payer: Self-pay | Admitting: Family Medicine

## 2017-06-13 ENCOUNTER — Ambulatory Visit (INDEPENDENT_AMBULATORY_CARE_PROVIDER_SITE_OTHER): Payer: BLUE CROSS/BLUE SHIELD | Admitting: Family Medicine

## 2017-06-13 ENCOUNTER — Encounter: Payer: Self-pay | Admitting: Family Medicine

## 2017-06-13 DIAGNOSIS — I10 Essential (primary) hypertension: Secondary | ICD-10-CM

## 2017-06-13 DIAGNOSIS — E7800 Pure hypercholesterolemia, unspecified: Secondary | ICD-10-CM

## 2017-06-13 MED ORDER — LOSARTAN POTASSIUM 50 MG PO TABS: 50.0000 mg | ORAL_TABLET | Freq: Every day | ORAL | 6 refills | Status: DC

## 2017-06-13 MED ORDER — METOPROLOL SUCCINATE ER 25 MG PO TB24: 25.0000 mg | ORAL_TABLET | Freq: Every day | ORAL | 6 refills | Status: DC

## 2017-06-13 MED ORDER — OMEGA-3-ACID ETHYL ESTERS 1 G PO CAPS: 1.0000 g | ORAL_CAPSULE | Freq: Two times a day (BID) | ORAL | 11 refills | Status: DC

## 2017-06-13 NOTE — Progress Notes (Signed)
Name: Carol Taylor   MRN: [id number]    DOB: [birth year]   Date:06/13/2017       Progress Note  Subjective  Chief Complaint  Chief Complaint  Patient presents with  . Hypertension    Needs refill on metoprolol and losartan.   . Allergic Rhinitis     Needs refill on fish oil and claritin, and aspirin.     pATIENT PRESENTS FOR MEDICATION REFILL.   Hypertension  This is a chronic problem. The current episode started more than 1 year ago. The problem is controlled. Pertinent negatives include no anxiety, blurred vision, chest pain, headaches, malaise/fatigue, neck pain, orthopnea, palpitations, peripheral edema, PND, shortness of breath or sweats. There are no associated agents to hypertension. Risk factors for coronary artery disease include dyslipidemia and smoking/tobacco exposure. Past treatments include angiotensin blockers and beta blockers. The current treatment provides moderate improvement. There are no compliance problems.  There is no history of angina, kidney disease, CAD/MI, CVA, heart failure, left ventricular hypertrophy, PVD or retinopathy. There is no history of chronic renal disease, a hypertension causing med or renovascular disease.  Hyperlipidemia  This is a chronic problem. The current episode started more than 1 year ago. The problem is controlled. She has no history of chronic renal disease. Factors aggravating her hyperlipidemia include thiazides. Pertinent negatives include no chest pain, focal weakness, myalgias or shortness of breath. Treatments tried: lovasa. The current treatment provides moderate improvement of lipids. There are no compliance problems.  There are no known risk factors for coronary artery disease.    No problem-specific Assessment & Plan notes found for this encounter.   Past Medical History:  Diagnosis Date  . Allergy   . Hyperlipidemia   . Hypertension     Past Surgical History:  Procedure Laterality Date  . TUBAL LIGATION       Family History  Problem Relation Age of Onset  . Cancer Father   . Cancer Sister     Social History   Social History  . Marital status: Married    Spouse name: N/A  . Number of children: N/A  . Years of education: N/A   Occupational History  . Not on file.   Social History Main Topics  . Smoking status: Current Every Day Smoker  . Smokeless tobacco: Never Used  . Alcohol use No  . Drug use: No  . Sexual activity: Yes   Other Topics Concern  . Not on file   Social History Narrative  . No narrative on file    Allergies  Allergen Reactions  . Sulfa Antibiotics Hives    Outpatient Medications Prior to Visit  Medication Sig Dispense Refill  . aspirin 81 MG tablet Take 1 tablet (81 mg total) by mouth 1 day or 1 dose. 30 tablet   . loratadine (CLARITIN) 10 MG tablet TAKE 1 TABLET BY MOUTH EVERY DAY 30 tablet 0  . Multiple Vitamins-Minerals (WOMENS MULTIVITAMIN PLUS) TABS Take 1 tablet by mouth 1 day or 1 dose.    . sodium chloride (OCEAN) 0.65 % SOLN nasal spray Place 1 spray into both nostrils as needed for congestion.    . vitamin E 400 UNIT capsule Take 1 capsule by mouth 1 day or 1 dose.    . losartan (COZAAR) 50 MG tablet TAKE 1 TABLET (50 MG TOTAL) BY MOUTH DAILY. 30 tablet 0  . metoprolol succinate (TOPROL-XL) 25 MG 24 hr tablet TAKE 1 TABLET (25 MG TOTAL) BY MOUTH DAILY. 30 tablet 0  .  omega-3 acid ethyl esters (LOVAZA) 1 g capsule Take 1 capsule (1 g total) by mouth 2 (two) times daily. 60 capsule 11  . azithromycin (ZITHROMAX) 250 MG tablet 2 today then 1 a day for 4 days (Patient not taking: Reported on 06/13/2017) 6 tablet 0   No facility-administered medications prior to visit.     Review of Systems  Constitutional: Negative for chills, fever, malaise/fatigue and weight loss.  HENT: Negative for ear discharge, ear pain and sore throat.   Eyes: Negative for blurred vision.  Respiratory: Negative for cough, sputum production, shortness of breath and  wheezing.   Cardiovascular: Negative for chest pain, palpitations, orthopnea, leg swelling and PND.  Gastrointestinal: Negative for abdominal pain, blood in stool, constipation, diarrhea, heartburn, melena and nausea.  Genitourinary: Negative for dysuria, frequency, hematuria and urgency.  Musculoskeletal: Negative for back pain, joint pain, myalgias and neck pain.  Skin: Negative for rash.  Neurological: Negative for dizziness, tingling, sensory change, focal weakness and headaches.  Endo/Heme/Allergies: Negative for environmental allergies and polydipsia. Does not bruise/bleed easily.  Psychiatric/Behavioral: Negative for depression and suicidal ideas. The patient is not nervous/anxious and does not have insomnia.      Objective  Vitals:   06/13/17 0820  BP: 110/68  Pulse: 77  SpO2: 97%  Weight: 159 lb (72.1 kg)  Height: 5' 1.5" (1.562 m)    Physical Exam  Constitutional: She is well-developed, well-nourished, and in no distress. No distress.  HENT:  Head: Normocephalic and atraumatic.  Right Ear: External ear normal.  Left Ear: External ear normal.  Nose: Nose normal.  Mouth/Throat: Oropharynx is clear and moist.  Eyes: Pupils are equal, round, and reactive to light. Conjunctivae and EOM are normal. Right eye exhibits no discharge. Left eye exhibits no discharge.  Neck: Normal range of motion. Neck supple. No JVD present. No thyromegaly present.  Cardiovascular: Normal rate, regular rhythm, normal heart sounds and intact distal pulses.  Exam reveals no gallop and no friction rub.   No murmur heard. Pulmonary/Chest: Effort normal and breath sounds normal. She has no wheezes. She has no rales.  Abdominal: Soft. Bowel sounds are normal. She exhibits no mass. There is no tenderness. There is no guarding.  Musculoskeletal: Normal range of motion. She exhibits no edema.  Lymphadenopathy:    She has no cervical adenopathy.  Neurological: She is alert. She has normal reflexes.   Skin: Skin is warm and dry. She is not diaphoretic.  Psychiatric: Mood and affect normal.  Nursing note and vitals reviewed.     Assessment & Plan  Problem List Items Addressed This Visit      Cardiovascular and Mediastinum   Essential hypertension   Relevant Medications   metoprolol succinate (TOPROL-XL) 25 MG 24 hr tablet   losartan (COZAAR) 50 MG tablet   omega-3 acid ethyl esters (LOVAZA) 1 g capsule   Other Relevant Orders   Renal Function Panel   Lipid Profile     Other   Pure hypercholesterolemia   Relevant Medications   metoprolol succinate (TOPROL-XL) 25 MG 24 hr tablet   losartan (COZAAR) 50 MG tablet   omega-3 acid ethyl esters (LOVAZA) 1 g capsule      Meds ordered this encounter  Medications  . metoprolol succinate (TOPROL-XL) 25 MG 24 hr tablet    Sig: Take 1 tablet (25 mg total) by mouth daily.    Dispense:  30 tablet    Refill:  6    sched appt for med refills  .  losartan (COZAAR) 50 MG tablet    Sig: Take 1 tablet (50 mg total) by mouth daily.    Dispense:  30 tablet    Refill:  6    sched appt for med refills  . omega-3 acid ethyl esters (LOVAZA) 1 g capsule    Sig: Take 1 capsule (1 g total) by mouth 2 (two) times daily.    Dispense:  60 capsule    Refill:  11      Dr. Hayden Rasmusseneanna Floyd [facility]an [facility]  06/13/17

## 2017-06-14 LAB — RENAL FUNCTION PANEL
Albumin: 4.3 g/dL (ref 3.5–5.5)
BUN/Creatinine Ratio: 11 (ref 9–23)
BUN: 7 mg/dL (ref 6–24)
CO2: 23 mmol/L (ref 20–29)
Calcium: 9.1 mg/dL (ref 8.7–10.2)
Chloride: 101 mmol/L (ref 96–106)
Creatinine, Ser: 0.63 mg/dL (ref 0.57–1.00)
GFR calc Af Amer: 114 mL/min/{1.73_m2} (ref >59)
GFR calc non Af Amer: 99 mL/min/{1.73_m2} (ref >59)
Glucose: 88 mg/dL (ref 65–99)
Phosphorus: 3.4 mg/dL (ref 2.5–4.5)
Potassium: 4.3 mmol/L (ref 3.5–5.2)
Sodium: 139 mmol/L (ref 134–144)

## 2017-06-14 LAB — LIPID PANEL
Chol/HDL Ratio: 5.6 ratio — ABNORMAL HIGH (ref 0.0–4.4)
Cholesterol, Total: 253 mg/dL — ABNORMAL HIGH (ref 100–199)
HDL: 45 mg/dL (ref >39)
LDL Calculated: 173 mg/dL — ABNORMAL HIGH (ref 0–99)
Triglycerides: 173 mg/dL — ABNORMAL HIGH (ref 0–149)
VLDL Cholesterol Cal: 35 mg/dL (ref 5–40)

## 2017-06-23 ENCOUNTER — Other Ambulatory Visit: Payer: Self-pay | Admitting: Family Medicine

## 2017-09-22 ENCOUNTER — Encounter: Payer: BLUE CROSS/BLUE SHIELD | Admitting: Family Medicine

## 2017-09-30 ENCOUNTER — Encounter: Payer: Self-pay | Admitting: Family Medicine

## 2017-09-30 ENCOUNTER — Ambulatory Visit (INDEPENDENT_AMBULATORY_CARE_PROVIDER_SITE_OTHER): Payer: BLUE CROSS/BLUE SHIELD | Admitting: Family Medicine

## 2017-09-30 VITALS — BP 130/64 | HR 60 | Ht 61.0 in | Wt 151.0 lb

## 2017-09-30 DIAGNOSIS — F17210 Nicotine dependence, cigarettes, uncomplicated: Secondary | ICD-10-CM | POA: Diagnosis not present

## 2017-09-30 DIAGNOSIS — Z0000 Encounter for general adult medical examination without abnormal findings: Secondary | ICD-10-CM

## 2017-09-30 DIAGNOSIS — Z1211 Encounter for screening for malignant neoplasm of colon: Secondary | ICD-10-CM

## 2017-09-30 DIAGNOSIS — Z1231 Encounter for screening mammogram for malignant neoplasm of breast: Secondary | ICD-10-CM

## 2017-09-30 DIAGNOSIS — Z1239 Encounter for other screening for malignant neoplasm of breast: Secondary | ICD-10-CM

## 2017-09-30 DIAGNOSIS — Z124 Encounter for screening for malignant neoplasm of cervix: Secondary | ICD-10-CM

## 2017-09-30 DIAGNOSIS — Z23 Encounter for immunization: Secondary | ICD-10-CM | POA: Diagnosis not present

## 2017-09-30 LAB — RESULTS CONSOLE HPV: CHL HPV: NEGATIVE

## 2017-09-30 LAB — HEMOCCULT GUIAC POC 1CARD (OFFICE): Fecal Occult Blood, POC: NEGATIVE

## 2017-09-30 LAB — HM PAP SMEAR: HM Pap smear: NORMAL

## 2017-09-30 NOTE — Progress Notes (Signed)
Name: Carol Taylor   MRN: [id number]    DOB: 01/28/1961   Date:09/30/2017       Progress Note  Subjective  Chief Complaint  Chief Complaint  Patient presents with  . Annual Exam    needs pap and refuses mammo and colonoscopy    Patient presents for annual physical exam.    No problem-specific Assessment & Plan notes found for this encounter.   Past Medical History:  Diagnosis Date  . Allergy   . Hyperlipidemia   . Hypertension     Past Surgical History:  Procedure Laterality Date  . TUBAL LIGATION      Family History  Problem Relation Age of Onset  . Cancer Father   . Cancer Sister     Social History   Socioeconomic History  . Marital status: Married    Spouse name: Not on file  . Number of children: Not on file  . Years of education: Not on file  . Highest education level: Not on file  Social Needs  . Financial resource strain: Not on file  . Food insecurity - worry: Not on file  . Food insecurity - inability: Not on file  . Transportation needs - medical: Not on file  . Transportation needs - non-medical: Not on file  Occupational History  . Not on file  Tobacco Use  . Smoking status: Current Every Day Smoker  . Smokeless tobacco: Never Used  Substance and Sexual Activity  . Alcohol use: No    Alcohol/week: 0.0 oz  . Drug use: No  . Sexual activity: Yes  Other Topics Concern  . Not on file  Social History Narrative  . Not on file    Allergies  Allergen Reactions  . Sulfa Antibiotics Hives    Outpatient Medications Prior to Visit  Medication Sig Dispense Refill  . aspirin 81 MG tablet Take 1 tablet (81 mg total) by mouth 1 day or 1 dose. 30 tablet   . loratadine (CLARITIN) 10 MG tablet TAKE 1 TABLET BY MOUTH EVERY DAY 30 tablet 4  . losartan (COZAAR) 50 MG tablet Take 1 tablet (50 mg total) by mouth daily. 30 tablet 6  . metoprolol succinate (TOPROL-XL) 25 MG 24 hr tablet Take 1 tablet (25 mg total) by mouth daily. 30 tablet 6  .  Multiple Vitamins-Minerals (WOMENS MULTIVITAMIN PLUS) TABS Take 1 tablet by mouth 1 day or 1 dose.    . omega-3 acid ethyl esters (LOVAZA) 1 g capsule Take 1 capsule (1 g total) by mouth 2 (two) times daily. 60 capsule 11  . sodium chloride (OCEAN) 0.65 % SOLN nasal spray Place 1 spray into both nostrils as needed for congestion.    . vitamin E 400 UNIT capsule Take 1 capsule by mouth 1 day or 1 dose.     No facility-administered medications prior to visit.     Review of Systems  Constitutional: Negative for chills, fever, malaise/fatigue and weight loss.  HENT: Negative for ear discharge, ear pain and sore throat.   Eyes: Negative for blurred vision.  Respiratory: Negative for cough, sputum production, shortness of breath and wheezing.   Cardiovascular: Negative for chest pain, palpitations and leg swelling.  Gastrointestinal: Negative for abdominal pain, blood in stool, constipation, diarrhea, heartburn, melena and nausea.  Genitourinary: Negative for dysuria, frequency, hematuria and urgency.  Musculoskeletal: Negative for back pain, joint pain, myalgias and neck pain.  Skin: Negative for rash.  Neurological: Negative for dizziness, tingling, sensory change, focal weakness  and headaches.  Endo/Heme/Allergies: Negative for environmental allergies and polydipsia. Does not bruise/bleed easily.  Psychiatric/Behavioral: Negative for depression and suicidal ideas. The patient is not nervous/anxious and does not have insomnia.      Objective  Vitals:   09/30/17 0836  BP: 130/64  Pulse: 60  Weight: 151 lb (68.5 kg)  Height: 5\' 1"  (1.549 m)    Physical Exam  Constitutional: She is well-developed, well-nourished, and in no distress. No distress.  HENT:  Head: Normocephalic and atraumatic.  Right Ear: Tympanic membrane, external ear and ear canal normal.  Left Ear: Tympanic membrane, external ear and ear canal normal.  Nose: Nose normal.  Mouth/Throat: Uvula is midline and oropharynx  is clear and moist.  Eyes: Conjunctivae, EOM and lids are normal. Pupils are equal, round, and reactive to light. Right eye exhibits no discharge. Left eye exhibits no discharge.  Fundoscopic exam:      The right eye shows no arteriolar narrowing.       The left eye shows no arteriolar narrowing.  Neck: Normal range of motion. Neck supple. Normal carotid pulses, no hepatojugular reflux and no JVD present. Carotid bruit is not present. No thyromegaly present.  Cardiovascular: Normal rate, regular rhythm, S1 normal, S2 normal, normal heart sounds and intact distal pulses. Exam reveals no gallop, no S3, no S4 and no friction rub.  No murmur heard. Pulmonary/Chest: Effort normal and breath sounds normal. She has no wheezes. She has no rales. Right breast exhibits no inverted nipple, no mass, no nipple discharge, no skin change and no tenderness. Left breast exhibits no inverted nipple, no mass, no nipple discharge, no skin change and no tenderness. Breasts are symmetrical.  Abdominal: Soft. Bowel sounds are normal. She exhibits no mass. There is no hepatosplenomegaly. There is no tenderness. There is no guarding and no CVA tenderness.  Genitourinary: Rectum normal, vagina normal, uterus normal, cervix normal, right adnexa normal, left adnexa normal and vulva normal. Rectal exam shows guaiac negative stool. No vaginal discharge found.  Musculoskeletal: Normal range of motion. She exhibits no edema.  Lymphadenopathy:       Head (right side): No submandibular adenopathy present.       Head (left side): No submandibular adenopathy present.    She has no cervical adenopathy.  Neurological: She is alert. She has normal sensation, normal strength, normal reflexes and intact cranial nerves.  Skin: Skin is warm and dry. No rash noted. She is not diaphoretic.  Psychiatric: Mood and affect normal.  Nursing note and vitals reviewed.     Assessment & Plan  Problem List Items Addressed This Visit       Other   Cigarette nicotine dependence without complication    Other Visit Diagnoses    Annual physical exam    -  Primary   Relevant Orders   Pap IG (Image Guided)   Encounter for Papanicolaou smear for cervical cancer screening       Influenza vaccine needed       Relevant Orders   Flu Vaccine QUAD 36+ mos IM (Completed)   Colon cancer screening       Relevant Orders   POCT occult blood stool (Completed)   Screening for breast cancer       Relevant Orders   MM Digital Screening   Cervical cancer screening       Relevant Orders   Pap IG (Image Guided)      No orders of the defined types were placed in this encounter.  Dr. [facility]  09/30/17

## 2017-10-01 ENCOUNTER — Other Ambulatory Visit: Payer: Self-pay

## 2017-10-01 ENCOUNTER — Other Ambulatory Visit: Payer: Self-pay | Admitting: Family Medicine

## 2017-10-01 DIAGNOSIS — N6489 Other specified disorders of breast: Secondary | ICD-10-CM | POA: Diagnosis not present

## 2017-10-01 DIAGNOSIS — R922 Inconclusive mammogram: Secondary | ICD-10-CM | POA: Diagnosis not present

## 2017-10-01 DIAGNOSIS — R928 Other abnormal and inconclusive findings on diagnostic imaging of breast: Secondary | ICD-10-CM

## 2017-10-01 LAB — SPECIMEN STATUS REPORT

## 2017-10-01 LAB — PAP IG (IMAGE GUIDED): PAP Smear Comment: 0

## 2017-10-10 ENCOUNTER — Ambulatory Visit
Admission: RE | Admit: 2017-10-10 | Discharge: 2017-10-10 | Disposition: A | Discharge status: Home / Self Care | Payer: Self-pay | Source: Ambulatory Visit | Attending: Family Medicine | Admitting: Family Medicine

## 2017-10-10 DIAGNOSIS — R928 Other abnormal and inconclusive findings on diagnostic imaging of breast: Secondary | ICD-10-CM

## 2017-10-10 DIAGNOSIS — R922 Inconclusive mammogram: Secondary | ICD-10-CM | POA: Diagnosis not present

## 2017-10-10 DIAGNOSIS — N6489 Other specified disorders of breast: Secondary | ICD-10-CM | POA: Diagnosis not present

## 2017-11-14 ENCOUNTER — Other Ambulatory Visit: Payer: Self-pay

## 2017-11-14 DIAGNOSIS — I10 Essential (primary) hypertension: Secondary | ICD-10-CM

## 2017-11-14 MED ORDER — METOPROLOL SUCCINATE ER 25 MG PO TB24: 25.0000 mg | ORAL_TABLET | Freq: Every day | ORAL | 3 refills | Status: DC

## 2017-11-14 MED ORDER — LOSARTAN POTASSIUM 50 MG PO TABS: 50.0000 mg | ORAL_TABLET | Freq: Every day | ORAL | 3 refills | Status: DC

## 2017-11-18 ENCOUNTER — Other Ambulatory Visit: Payer: Self-pay | Admitting: Family Medicine

## 2018-02-15 ENCOUNTER — Other Ambulatory Visit: Payer: Self-pay | Admitting: Family Medicine

## 2018-02-16 ENCOUNTER — Other Ambulatory Visit: Payer: Self-pay | Admitting: Family Medicine

## 2018-02-16 DIAGNOSIS — I10 Essential (primary) hypertension: Secondary | ICD-10-CM

## 2018-03-17 ENCOUNTER — Other Ambulatory Visit: Payer: Self-pay | Admitting: Family Medicine

## 2018-03-17 DIAGNOSIS — I10 Essential (primary) hypertension: Secondary | ICD-10-CM

## 2018-03-24 ENCOUNTER — Ambulatory Visit (INDEPENDENT_AMBULATORY_CARE_PROVIDER_SITE_OTHER): Payer: BLUE CROSS/BLUE SHIELD | Admitting: Family Medicine

## 2018-03-24 ENCOUNTER — Encounter: Payer: Self-pay | Admitting: Family Medicine

## 2018-03-24 VITALS — BP 128/90 | HR 80 | Ht 61.0 in | Wt 162.0 lb

## 2018-03-24 DIAGNOSIS — I10 Essential (primary) hypertension: Secondary | ICD-10-CM | POA: Diagnosis not present

## 2018-03-24 DIAGNOSIS — J301 Allergic rhinitis due to pollen: Secondary | ICD-10-CM | POA: Diagnosis not present

## 2018-03-24 DIAGNOSIS — F17200 Nicotine dependence, unspecified, uncomplicated: Secondary | ICD-10-CM

## 2018-03-24 MED ORDER — LOSARTAN POTASSIUM 50 MG PO TABS: 50.0000 mg | ORAL_TABLET | Freq: Every day | ORAL | 1 refills | Status: DC

## 2018-03-24 MED ORDER — METOPROLOL SUCCINATE ER 25 MG PO TB24: 25.0000 mg | ORAL_TABLET | Freq: Every day | ORAL | 1 refills | Status: DC

## 2018-03-24 MED ORDER — LORATADINE 10 MG PO TABS: 10.0000 mg | ORAL_TABLET | Freq: Every day | ORAL | 1 refills | Status: DC

## 2018-03-24 NOTE — Progress Notes (Signed)
Name: Carol Taylor   MRN: [id number]    DOB: 06/21/1961   Date:03/24/2018       Progress Note  Subjective  Chief Complaint  Chief Complaint  Patient presents with  . Allergic Rhinitis   . Hypertension    Hypertension  This is a chronic problem. The current episode started more than 1 year ago. The problem has been gradually improving since onset. The problem is controlled. Associated symptoms include blurred vision. Pertinent negatives include no anxiety, chest pain, headaches, malaise/fatigue, neck pain, orthopnea, palpitations, peripheral edema, PND, shortness of breath or sweats. There are no associated agents to hypertension. Risk factors for coronary artery disease include dyslipidemia. Past treatments include angiotensin blockers and beta blockers. The current treatment provides moderate improvement. There is no history of chronic renal disease.  Hyperlipidemia  This is a chronic problem. The current episode started more than 1 year ago. The problem is controlled. Recent lipid tests were reviewed and are normal. She has no history of chronic renal disease, diabetes, hypothyroidism, liver disease, obesity or nephrotic syndrome. There are no known factors aggravating her hyperlipidemia. Pertinent negatives include no chest pain, focal sensory loss, focal weakness, leg pain, myalgias or shortness of breath. Treatments tried: omega 3. The current treatment provides moderate improvement of lipids. There are no compliance problems.     No problem-specific Assessment & Plan notes found for this encounter.   Past Medical History:  Diagnosis Date  . Allergy   . Hyperlipidemia   . Hypertension     Past Surgical History:  Procedure Laterality Date  . TUBAL LIGATION      Family History  Problem Relation Age of Onset  . Cancer Father   . Cancer Sister   . Breast cancer Sister 53    Social History   Socioeconomic History  . Marital status: Married    Spouse name: Not on  file  . Number of children: Not on file  . Years of education: Not on file  . Highest education level: Not on file  Occupational History  . Not on file  Social Needs  . Financial resource strain: Not on file  . Food insecurity:    Worry: Not on file    Inability: Not on file  . Transportation needs:    Medical: Not on file    Non-medical: Not on file  Tobacco Use  . Smoking status: Current Every Day Smoker    Types: Cigarettes  . Smokeless tobacco: Never Used  . Tobacco comment: patches and pills   Substance and Sexual Activity  . Alcohol use: No    Alcohol/week: 0.0 oz  . Drug use: No  . Sexual activity: Yes  Lifestyle  . Physical activity:    Days per week: Not on file    Minutes per session: Not on file  . Stress: Not on file  Relationships  . Social connections:    Talks on phone: Not on file    Gets together: Not on file    Attends religious service: Not on file    Active member of club or organization: Not on file    Attends meetings of clubs or organizations: Not on file    Relationship status: Not on file  . Intimate partner violence:    Fear of current or ex partner: Not on file    Emotionally abused: Not on file    Physically abused: Not on file    Forced sexual activity: Not on file  Other Topics  Concern  . Not on file  Social History Narrative  . Not on file    Allergies  Allergen Reactions  . Sulfa Antibiotics Hives    Outpatient Medications Prior to Visit  Medication Sig Dispense Refill  . aspirin 81 MG tablet Take 1 tablet (81 mg total) by mouth 1 day or 1 dose. 30 tablet   . Multiple Vitamins-Minerals (WOMENS MULTIVITAMIN PLUS) TABS Take 1 tablet by mouth 1 day or 1 dose.    . omega-3 acid ethyl esters (LOVAZA) 1 g capsule Take 1 capsule (1 g total) by mouth 2 (two) times daily. 60 capsule 11  . sodium chloride (OCEAN) 0.65 % SOLN nasal spray Place 1 spray into both nostrils as needed for congestion.    . vitamin E 400 UNIT capsule Take 1  capsule by mouth 1 day or 1 dose.    . loratadine (CLARITIN) 10 MG tablet TAKE 1 TABLET BY MOUTH EVERY DAY 30 tablet 2  . losartan (COZAAR) 50 MG tablet Take 1 tablet (50 mg total) by mouth daily. 30 tablet 3  . metoprolol succinate (TOPROL-XL) 25 MG 24 hr tablet TAKE 1 TABLET BY MOUTH EVERY DAY 30 tablet 0   No facility-administered medications prior to visit.     Review of Systems  Constitutional: Negative for chills, fever, malaise/fatigue and weight loss.  HENT: Negative for ear discharge, ear pain and sore throat.   Eyes: Positive for blurred vision.  Respiratory: Negative for cough, sputum production, shortness of breath and wheezing.   Cardiovascular: Negative for chest pain, palpitations, orthopnea, leg swelling and PND.  Gastrointestinal: Negative for abdominal pain, blood in stool, constipation, diarrhea, heartburn, melena and nausea.  Genitourinary: Negative for dysuria, frequency, hematuria and urgency.  Musculoskeletal: Negative for back pain, joint pain, myalgias and neck pain.  Skin: Negative for rash.  Neurological: Negative for dizziness, tingling, sensory change, focal weakness and headaches.  Endo/Heme/Allergies: Negative for environmental allergies and polydipsia. Does not bruise/bleed easily.  Psychiatric/Behavioral: Negative for depression and suicidal ideas. The patient is not nervous/anxious and does not have insomnia.      Objective  Vitals:   03/24/18 0824  BP: 128/90  Pulse: 80  Weight: 162 lb (73.5 kg)  Height:  (1.549 m)    Physical Exam  Constitutional: She is oriented to person, place, and time. She appears well-developed and well-nourished.  HENT:  Head: Normocephalic.  Right Ear: External ear normal.  Left Ear: External ear normal.  Mouth/Throat: Oropharynx is clear and moist.  Eyes: Pupils are equal, round, and reactive to light. Conjunctivae and EOM are normal. Lids are everted and swept, no foreign bodies found. Left eye exhibits no  hordeolum. No foreign body present in the left eye. Right conjunctiva is not injected. Left conjunctiva is not injected. No scleral icterus.  Neck: Normal range of motion. Neck supple. No JVD present. No tracheal deviation present. No thyromegaly present.  Cardiovascular: Normal rate, regular rhythm, normal heart sounds and intact distal pulses. Exam reveals no gallop and no friction rub.  No murmur heard. Pulmonary/Chest: Effort normal and breath sounds normal. No respiratory distress. She has no wheezes. She has no rales.  Abdominal: Soft. Bowel sounds are normal. She exhibits no mass. There is no hepatosplenomegaly. There is no tenderness. There is no rebound and no guarding.  Musculoskeletal: Normal range of motion. She exhibits no edema or tenderness.  Lymphadenopathy:    She has no cervical adenopathy.  Neurological: She is alert and oriented to person, place,  and time. She has normal strength. She displays normal reflexes. No cranial nerve deficit.  Skin: Skin is warm. No rash noted.  Psychiatric: She has a normal mood and affect. Her mood appears not anxious. She does not exhibit a depressed mood.  Nursing note and vitals reviewed.     Assessment & Plan  Problem List Items Addressed This Visit      Cardiovascular and Mediastinum   Essential hypertension   Relevant Medications   losartan (COZAAR) 50 MG tablet   metoprolol succinate (TOPROL-XL) 25 MG 24 hr tablet    Other Visit Diagnoses    Tobacco dependence    -  Primary   Seasonal allergic rhinitis due to pollen       Relevant Medications   loratadine (CLARITIN) 10 MG tablet      Meds ordered this encounter  Medications  . loratadine (CLARITIN) 10 MG tablet    Sig: Take 1 tablet (10 mg total) by mouth daily.    Dispense:  90 tablet    Refill:  1  . losartan (COZAAR) 50 MG tablet    Sig: Take 1 tablet (50 mg total) by mouth daily.    Dispense:  90 tablet    Refill:  1  . metoprolol succinate (TOPROL-XL) 25 MG 24  hr tablet    Sig: Take 1 tablet (25 mg total) by mouth daily.    Dispense:  90 tablet    Refill:  1      Dr. Elizabeth Sauer [facility]  03/24/18

## 2018-03-24 NOTE — Patient Instructions (Addendum)
Smoking Tobacco Information Smoking tobacco will very likely harm your health. Tobacco contains a poisonous (toxic), colorless chemical called nicotine. Nicotine affects the brain and makes tobacco addictive. This change in your brain can make it hard to stop smoking. Tobacco also has other toxic chemicals that can hurt your body and raise your risk of many cancers. How can smoking tobacco affect me? Smoking tobacco can increase your chances of having serious health conditions, such as:  Cancer. Smoking is most commonly associated with lung cancer, but can lead to cancer in other parts of the body.  Chronic obstructive pulmonary disease (COPD). This is a long-term lung condition that makes it hard to breathe. It also gets worse over time.  High blood pressure (hypertension), heart disease, stroke, or heart attack.  Lung infections, such as pneumonia.  Cataracts. This is when the lenses in the eyes become clouded.  Digestive problems. This may include peptic ulcers, heartburn, and gastroesophageal reflux disease (GERD).  Oral health problems, such as gum disease and tooth loss.  Loss of taste and smell.  Smoking can affect your appearance by causing:  Wrinkles.  Yellow or stained teeth, fingers, and fingernails.  Smoking tobacco can also affect your social life.  Many workplaces, restaurants, hotels, and public places are tobacco-free. This means that you may experience challenges in finding places to smoke when away from home.  The cost of a smoking habit can be expensive. Expenses for someone who smokes come in two ways: ? You spend money on a regular basis to buy tobacco. ? Your health care costs in the long-term are higher if you smoke.  Tobacco smoke can also affect the health of those around you. Children of smokers have greater chances of: ? Sudden infant death syndrome (SIDS). ? Ear infections. ? Lung infections.  What lifestyle changes can be made?  Do not start  smoking. Quit if you already do.  To quit smoking: ? Make a plan to quit smoking and commit yourself to it. Look for programs to help you and ask your health care provider for recommendations and ideas. ? Talk with your health care provider about using nicotine replacement medicines to help you quit. Medicine replacement medicines include gum, lozenges, patches, sprays, or pills. ? Do not replace cigarette smoking with electronic cigarettes, which are commonly called e-cigarettes. The safety of e-cigarettes is not known, and some may contain harmful chemicals. ? Avoid places, people, or situations that tempt you to smoke. ? If you try to quit but return to smoking, don't give up hope. It is very common for people to try a number of times before they fully succeed. When you feel ready again, give it another try.  Quitting smoking might affect the way you eat as well as your weight. Be prepared to monitor your eating habits. Get support in planning and following a healthy diet.  Ask your health care provider about having regular tests (screenings) to check for cancer. This may include blood tests, imaging tests, and other tests.  Exercise regularly. Consider taking walks, joining a gym, or doing yoga or exercise classes.  Develop skills to manage your stress. These skills include meditation. What are the benefits of quitting smoking? By quitting smoking, you may:  Lower your risk of getting cancer and other diseases caused by smoking.  Live longer.  Breathe better.  Lower your blood pressure and heart rate.  Stop your addiction to tobacco.  Stop creating secondhand smoke that hurts other people.  Improve your   sense of taste and smell.  Look better over time, due to having fewer wrinkles and less staining.  What can happen if changes are not made? If you do not stop smoking, you may:  Get cancer and other diseases.  Develop COPD or other long-term (chronic) lung  conditions.  Develop serious problems with your heart and blood vessels (cardiovascular system).  Need more tests to screen for problems caused by smoking.  Have higher, long-term healthcare costs from medicines or treatments related to smoking.  Continue to have worsening changes in your lungs, mouth, and nose.  Where to find support: To get support to quit smoking, consider:  Asking your health care provider for more information and resources.  Taking classes to learn more about quitting smoking.  Looking for local organizations that offer resources about quitting smoking.  Joining a support group for people who want to quit smoking in your local community.  Where to find more information: You may find more information about quitting smoking from:  HelpGuide.org: www.helpguide.org/articles/addictions/how-to-quit-smoking.htm  [employer]: smokefree.gov  American Lung Association: www.lung.org  Contact a health care provider if:  You have problems breathing.  Your lips, nose, or fingers turn blue.  You have chest pain.  You are coughing up blood.  You feel faint or you pass out.  You have other noticeable changes that cause you to worry. Summary  Smoking tobacco can negatively affect your health, the health of those around you, your finances, and your social life.  Do not start smoking. Quit if you already do. If you need help quitting, ask your health care provider.  Think about joining a support group for people who want to quit smoking in your local community. There are many effective programs that will help you to quit this behavior. This information is not intended to replace advice given to you by your health care provider. Make sure you discuss any questions you have with your health care provider. Document Released: 11/12/2016 Document Revised: 11/12/2016 Document Reviewed: 11/12/2016 Elsevier Interactive Patient Education  2018 [employer]. Mediterranean Diet A Mediterranean diet refers to food and lifestyle choices that are based on the traditions of countries located on the [employer]. This way of eating has been shown to help prevent certain conditions and improve outcomes for people who have chronic diseases, like kidney disease and heart disease. What are tips for following this plan? Lifestyle  Cook and eat meals together with your family, when possible.  Drink enough fluid to keep your urine clear or pale yellow.  Be physically active every day. This includes: ? Aerobic exercise like running or swimming. ? Leisure activities like gardening, walking, or housework.  Get 7-8 hours of sleep each night.  If recommended by your health care provider, drink red wine in moderation. This means 1 glass a day for nonpregnant women and 2 glasses a day for men. A glass of wine equals 5 oz (150 mL). Reading food labels  Check the serving size of packaged foods. For foods such as rice and pasta, the serving size refers to the amount of cooked product, not dry.  Check the total fat in packaged foods. Avoid foods that have saturated fat or trans fats.  Check the ingredients list for added sugars, such as corn syrup. Shopping  At the grocery store, buy most of your food from the areas near the walls of the store. This includes: ? Fresh fruits and vegetables (produce). ? Grains, beans, nuts, and seeds. Some of these  may be available in unpackaged forms or large amounts (in bulk). ? Fresh seafood. ? Poultry and eggs. ? Low-fat dairy products.  Buy whole ingredients instead of prepackaged foods.  Buy fresh fruits and vegetables in-season from local farmers markets.  Buy frozen fruits and vegetables in resealable bags.  If you do not have access to quality fresh seafood, buy precooked frozen shrimp or canned fish, such as tuna, salmon, or sardines.  Buy small amounts of raw or cooked vegetables, salads, or olives  from the deli or salad bar at your store.  Stock your pantry so you always have certain foods on hand, such as olive oil, canned tuna, canned tomatoes, rice, pasta, and beans. Cooking  Cook foods with extra-virgin olive oil instead of using butter or other vegetable oils.  Have meat as a side dish, and have vegetables or grains as your main dish. This means having meat in small portions or adding small amounts of meat to foods like pasta or stew.  Use beans or vegetables instead of meat in common dishes like chili or lasagna.  Experiment with different cooking methods. Try roasting or broiling vegetables instead of steaming or sauteing them.  Add frozen vegetables to soups, stews, pasta, or rice.  Add nuts or seeds for added healthy fat at each meal. You can add these to yogurt, salads, or vegetable dishes.  Marinate fish or vegetables using olive oil, lemon juice, garlic, and fresh herbs. Meal planning  Plan to eat 1 vegetarian meal one day each week. Try to work up to 2 vegetarian meals, if possible.  Eat seafood 2 or more times a week.  Have healthy snacks readily available, such as: ? Vegetable sticks with hummus. ? [place] yogurt. ? Fruit and nut trail mix.  Eat balanced meals throughout the week. This includes: ? Fruit: 2-3 servings a day ? Vegetables: 4-5 servings a day ? Low-fat dairy: 2 servings a day ? Fish, poultry, or lean meat: 1 serving a day ? Beans and legumes: 2 or more servings a week ? Nuts and seeds: 1-2 servings a day ? Whole grains: 6-8 servings a day ? Extra-virgin olive oil: 3-4 servings a day  Limit red meat and sweets to only a few servings a month What are my food choices?  Mediterranean diet ? Recommended ? Grains: Whole-grain pasta. Brown rice. Bulgar wheat. Polenta. Couscous. Whole-wheat bread. Orpah Cobb. ? Vegetables: Artichokes. Beets. Broccoli. Cabbage. Carrots. Eggplant. Green beans. Chard. Kale. Spinach. Onions. Leeks. Peas. Squash.  Tomatoes. Peppers. Radishes. ? Fruits: Apples. Apricots. Avocado. Berries. Bananas. Cherries. Dates. Figs. Grapes. Lemons. Melon. Oranges. Peaches. Plums. Pomegranate. ? Meats and other protein foods: Beans. Almonds. Sunflower seeds. Pine nuts. Peanuts. Cod. Salmon. Scallops. Shrimp. Tuna. Tilapia. Clams. Oysters. Eggs. ? Dairy: Low-fat milk. Cheese. Greek yogurt. ? Beverages: Water. Red wine. Herbal tea. ? Fats and oils: Extra virgin olive oil. Avocado oil. Grape seed oil. ? Sweets and desserts: [place] yogurt with honey. Baked apples. Poached pears. Trail mix. ? Seasoning and other foods: Basil. Cilantro. Coriander. Cumin. Mint. Parsley. Sage. Rosemary. Tarragon. Garlic. Oregano. Thyme. Pepper. Balsalmic vinegar. Tahini. Hummus. Tomato sauce. Olives. Mushrooms. ? Limit these ? Grains: Prepackaged pasta or rice dishes. Prepackaged cereal with added sugar. ? Vegetables: Deep fried potatoes (french fries). ? Fruits: Fruit canned in syrup. ? Meats and other protein foods: Beef. Pork. Lamb. Poultry with skin. Hot dogs. Tomasa Blase. ? Dairy: Ice cream. Sour cream. Whole milk. ? Beverages: Juice. Sugar-sweetened soft drinks. Beer. Liquor and spirits. ?  Fats and oils: Butter. Canola oil. Vegetable oil. Beef fat (tallow). Lard. ? Sweets and desserts: Cookies. Cakes. Pies. Candy. ? Seasoning and other foods: Mayonnaise. Premade sauces and marinades. ? The items listed may not be a complete list. Talk with your dietitian about what dietary choices are right for you. Summary  The Mediterranean diet includes both food and lifestyle choices.  Eat a variety of fresh fruits and vegetables, beans, nuts, seeds, and whole grains.  Limit the amount of red meat and sweets that you eat.  Talk with your health care provider about whether it is safe for you to drink red wine in moderation. This means 1 glass a day for nonpregnant women and 2 glasses a day for men. A glass of wine equals 5 oz (150 mL). This information  is not intended to replace advice given to you by your health care provider. Make sure you discuss any questions you have with your health care provider. Document Released: 06/20/2016 Document Revised: 07/23/2016 Document Reviewed: 06/20/2016 Elsevier Interactive Patient Education  [employer].

## 2018-07-01 ENCOUNTER — Other Ambulatory Visit: Payer: Self-pay | Admitting: Family Medicine

## 2018-07-01 DIAGNOSIS — I10 Essential (primary) hypertension: Secondary | ICD-10-CM

## 2018-09-24 ENCOUNTER — Ambulatory Visit (INDEPENDENT_AMBULATORY_CARE_PROVIDER_SITE_OTHER): Payer: BLUE CROSS/BLUE SHIELD | Admitting: Family Medicine

## 2018-09-24 ENCOUNTER — Encounter: Payer: Self-pay | Admitting: Family Medicine

## 2018-09-24 VITALS — BP 122/82 | HR 72 | Resp 16 | Ht 62.0 in | Wt 161.0 lb

## 2018-09-24 DIAGNOSIS — E663 Overweight: Secondary | ICD-10-CM

## 2018-09-24 DIAGNOSIS — E7800 Pure hypercholesterolemia, unspecified: Secondary | ICD-10-CM | POA: Diagnosis not present

## 2018-09-24 DIAGNOSIS — J301 Allergic rhinitis due to pollen: Secondary | ICD-10-CM

## 2018-09-24 DIAGNOSIS — I10 Essential (primary) hypertension: Secondary | ICD-10-CM | POA: Diagnosis not present

## 2018-09-24 DIAGNOSIS — F17210 Nicotine dependence, cigarettes, uncomplicated: Secondary | ICD-10-CM

## 2018-09-24 DIAGNOSIS — Z23 Encounter for immunization: Secondary | ICD-10-CM

## 2018-09-24 MED ORDER — OMEGA-3-ACID ETHYL ESTERS 1 G PO CAPS: 1.0000 g | ORAL_CAPSULE | Freq: Two times a day (BID) | ORAL | 11 refills | Status: DC

## 2018-09-24 MED ORDER — LORATADINE 10 MG PO TABS: 10.0000 mg | ORAL_TABLET | Freq: Every day | ORAL | 1 refills | Status: DC

## 2018-09-24 MED ORDER — LOSARTAN POTASSIUM 50 MG PO TABS: 50.0000 mg | ORAL_TABLET | Freq: Every day | ORAL | 1 refills | Status: DC

## 2018-09-24 MED ORDER — METOPROLOL SUCCINATE ER 25 MG PO TB24: 25.0000 mg | ORAL_TABLET | Freq: Every day | ORAL | 1 refills | Status: DC

## 2018-09-24 NOTE — Progress Notes (Signed)
Date:  09/24/2018   Name:  Carol Taylor   DOB:  1961-07-03   MRN:  [id number]   Chief Complaint: Hypertension Hypertension  This is a chronic problem. The current episode started more than 1 year ago. The problem has been waxing and waning since onset. The problem is controlled. Pertinent negatives include no anxiety, blurred vision, chest pain, headaches, malaise/fatigue, neck pain, orthopnea, palpitations, peripheral edema, PND, shortness of breath or sweats. There are no associated agents to hypertension. Risk factors for coronary artery disease include dyslipidemia. Past treatments include beta blockers and angiotensin blockers. The current treatment provides moderate improvement. There are no compliance problems.  There is no history of angina, kidney disease, CAD/MI, CVA, heart failure, left ventricular hypertrophy, PVD or retinopathy. There is no history of chronic renal disease, a hypertension causing med or renovascular disease.     Review of Systems  Constitutional: Negative.  Negative for chills, fatigue, fever, malaise/fatigue and unexpected weight change.  HENT: Negative for congestion, ear discharge, ear pain, rhinorrhea, sinus pressure, sneezing and sore throat.   Eyes: Negative for blurred vision, photophobia, pain, discharge, redness and itching.  Respiratory: Negative for cough, shortness of breath, wheezing and stridor.   Cardiovascular: Negative for chest pain, palpitations, orthopnea and PND.  Gastrointestinal: Negative for abdominal pain, blood in stool, constipation, diarrhea, nausea and vomiting.  Endocrine: Negative for cold intolerance, heat intolerance, polydipsia, polyphagia and polyuria.  Genitourinary: Negative for dysuria, flank pain, frequency, hematuria, menstrual problem, pelvic pain, urgency, vaginal bleeding and vaginal discharge.  Musculoskeletal: Negative for arthralgias, back pain, myalgias and neck pain.  Skin: Negative for rash.    Allergic/Immunologic: Negative for environmental allergies and food allergies.  Neurological: Negative for dizziness, weakness, light-headedness, numbness and headaches.  Hematological: Negative for adenopathy. Does not bruise/bleed easily.  Psychiatric/Behavioral: Negative for dysphoric mood. The patient is not nervous/anxious.     Patient Active Problem List   Diagnosis Date Noted  . Pure hypercholesterolemia 06/13/2017  . Chronic seasonal allergic rhinitis due to pollen 10/01/2016  . Essential hypertension 10/18/2015  . Familial multiple lipoprotein-type hyperlipidemia 04/14/2015  . Routine general medical examination at a health care facility 04/14/2015  . H/O: HTN (hypertension) 04/14/2015  . Cigarette nicotine dependence without complication 04/14/2015  . Overweight (BMI 25.0-29.9) 04/14/2015    Allergies  Allergen Reactions  . Sulfa Antibiotics Hives    Past Surgical History:  Procedure Laterality Date  . TUBAL LIGATION      Social History   Tobacco Use  . Smoking status: Current Every Day Smoker    Types: Cigarettes  . Smokeless tobacco: Never Used  . Tobacco comment: patches and pills   Substance Use Topics  . Alcohol use: No    Alcohol/week: 0.0 standard drinks  . Drug use: No     Medication list has been reviewed and updated.  Current Meds  Medication Sig  . aspirin 81 MG tablet Take 1 tablet (81 mg total) by mouth 1 day or 1 dose.  . loratadine (CLARITIN) 10 MG tablet Take 1 tablet (10 mg total) by mouth daily.  Marland Kitchen losartan (COZAAR) 50 MG tablet Take 1 tablet (50 mg total) by mouth daily.  . metoprolol succinate (TOPROL-XL) 25 MG 24 hr tablet Take 1 tablet (25 mg total) by mouth daily.  . Multiple Vitamins-Minerals (WOMENS MULTIVITAMIN PLUS) TABS Take 1 tablet by mouth 1 day or 1 dose.  . omega-3 acid ethyl esters (LOVAZA) 1 g capsule Take 1 capsule (1 g total) by  mouth 2 (two) times daily.  . sodium chloride (OCEAN) 0.65 % SOLN nasal spray Place 1  spray into both nostrils as needed for congestion.  . vitamin E 400 UNIT capsule Take 1 capsule by mouth 1 day or 1 dose.  . [DISCONTINUED] Cromolyn Sodium (NASAL ALLERGY NA) Place into the nose.  . [DISCONTINUED] losartan (COZAAR) 50 MG tablet TAKE 1 TABLET BY MOUTH EVERY DAY  . [DISCONTINUED] metoprolol succinate (TOPROL-XL) 25 MG 24 hr tablet Take 1 tablet (25 mg total) by mouth daily.  . [DISCONTINUED] omega-3 acid ethyl esters (LOVAZA) 1 g capsule Take 1 capsule (1 g total) by mouth 2 (two) times daily.    PHQ 2/9 Scores 09/30/2017 06/13/2017 04/12/2016  PHQ - 2 Score 0 0 0  PHQ- 9 Score 0 - -    Physical Exam  Constitutional: She is oriented to person, place, and time. She appears well-developed and well-nourished.  HENT:  Head: Normocephalic.  Right Ear: External ear normal.  Left Ear: External ear normal.  Mouth/Throat: Oropharynx is clear and moist.  Eyes: Pupils are equal, round, and reactive to light. Conjunctivae and EOM are normal. Lids are everted and swept, no foreign bodies found. Left eye exhibits no hordeolum. No foreign body present in the left eye. Right conjunctiva is not injected. Left conjunctiva is not injected. No scleral icterus.  Neck: Normal range of motion. Neck supple. No JVD present. No tracheal deviation present. No thyromegaly present.  Cardiovascular: Normal rate, regular rhythm, normal heart sounds and intact distal pulses. Exam reveals no gallop and no friction rub.  No murmur heard. Pulmonary/Chest: Effort normal and breath sounds normal. No respiratory distress. She has no wheezes. She has no rales.  Abdominal: Soft. Bowel sounds are normal. She exhibits no mass. There is no hepatosplenomegaly. There is no tenderness. There is no rebound and no guarding.  Musculoskeletal: Normal range of motion. She exhibits no edema or tenderness.  Lymphadenopathy:    She has no cervical adenopathy.  Neurological: She is alert and oriented to person, place, and time.  She has normal strength. She displays normal reflexes. No cranial nerve deficit.  Skin: Skin is warm. No rash noted.  Psychiatric: She has a normal mood and affect. Her mood appears not anxious. She does not exhibit a depressed mood.  Nursing note and vitals reviewed.   BP 122/82   Pulse 72   Resp 16   Ht 5\' 2"  (1.575 m)   Wt 161 lb (73 kg)   SpO2 99%   BMI 29.45 kg/m   Assessment and Plan:  1. Essential hypertension Chronic Controlled Continue metoprolol XL 25 daily and losartan 50 mg daily. Check renal panel. - metoprolol succinate (TOPROL-XL) 25 MG 24 hr tablet; Take 1 tablet (25 mg total) by mouth daily.  Dispense: 90 tablet; Refill: 1 - losartan (COZAAR) 50 MG tablet; Take 1 tablet (50 mg total) by mouth daily.  Dispense: 90 tablet; Refill: 1 - Renal Function Panel  2. Pure hypercholesterolemia Chronic Controlled Continue Omega 3 (Lovaza) 1 gm daily. Check lipid panel. - omega-3 acid ethyl esters (LOVAZA) 1 g capsule; Take 1 capsule (1 g total) by mouth 2 (two) times daily.  Dispense: 60 capsule; Refill: 11 - Lipid panel  3. Cigarette nicotine dependence without complication Patient has been advised of the health risks of smoking and counseled concerning cessation of tobacco products. I spent over 3 minutes for discussion and to answer questions.  4. Overweight (BMI 25.0-29.9) Health risks of being over weight  were discussed and patient was counseled on weight loss options and exercise.  5. Chronic seasonal allergic rhinitis due to pollen Chronic Episodic. Continue loratadine 10 mg.  - loratadine (CLARITIN) 10 MG tablet; Take 1 tablet (10 mg total) by mouth daily.  Dispense: 90 tablet; Refill: 1  6. Need for immunization against influenza Discussed and administered. - Flu Vaccine QUAD 36+ mos IM   Dr. Elizabeth [facility]  09/24/2018

## 2018-09-25 LAB — LIPID PANEL
Chol/HDL Ratio: 5.6 ratio — ABNORMAL HIGH (ref 0.0–4.4)
Cholesterol, Total: 265 mg/dL — ABNORMAL HIGH (ref 100–199)
HDL: 47 mg/dL (ref >39)
LDL Calculated: 175 mg/dL — ABNORMAL HIGH (ref 0–99)
Triglycerides: 213 mg/dL — ABNORMAL HIGH (ref 0–149)
VLDL Cholesterol Cal: 43 mg/dL — ABNORMAL HIGH (ref 5–40)

## 2018-09-25 LAB — RENAL FUNCTION PANEL
Albumin: 4.4 g/dL (ref 3.5–5.5)
BUN/Creatinine Ratio: 13 (ref 9–23)
BUN: 9 mg/dL (ref 6–24)
CO2: 22 mmol/L (ref 20–29)
Calcium: 9.2 mg/dL (ref 8.7–10.2)
Chloride: 100 mmol/L (ref 96–106)
Creatinine, Ser: 0.69 mg/dL (ref 0.57–1.00)
GFR calc Af Amer: 110 mL/min/{1.73_m2} (ref >59)
GFR calc non Af Amer: 96 mL/min/{1.73_m2} (ref >59)
Glucose: 91 mg/dL (ref 65–99)
Phosphorus: 3.5 mg/dL (ref 2.5–4.5)
Potassium: 4.1 mmol/L (ref 3.5–5.2)
Sodium: 137 mmol/L (ref 134–144)

## 2019-03-19 ENCOUNTER — Other Ambulatory Visit: Payer: Self-pay | Admitting: Family Medicine

## 2019-03-19 DIAGNOSIS — I10 Essential (primary) hypertension: Secondary | ICD-10-CM

## 2019-03-23 ENCOUNTER — Other Ambulatory Visit: Payer: Self-pay | Admitting: Family Medicine

## 2019-03-23 DIAGNOSIS — I10 Essential (primary) hypertension: Secondary | ICD-10-CM

## 2019-03-25 ENCOUNTER — Encounter: Payer: Self-pay | Admitting: Family Medicine

## 2019-03-25 ENCOUNTER — Ambulatory Visit (INDEPENDENT_AMBULATORY_CARE_PROVIDER_SITE_OTHER): Payer: Self-pay | Admitting: Family Medicine

## 2019-03-25 ENCOUNTER — Other Ambulatory Visit: Payer: Self-pay

## 2019-03-25 VITALS — BP 130/80 | HR 80 | Ht 62.0 in | Wt 160.0 lb

## 2019-03-25 DIAGNOSIS — J301 Allergic rhinitis due to pollen: Secondary | ICD-10-CM

## 2019-03-25 DIAGNOSIS — E663 Overweight: Secondary | ICD-10-CM

## 2019-03-25 DIAGNOSIS — I10 Essential (primary) hypertension: Secondary | ICD-10-CM | POA: Diagnosis not present

## 2019-03-25 DIAGNOSIS — F17210 Nicotine dependence, cigarettes, uncomplicated: Secondary | ICD-10-CM

## 2019-03-25 DIAGNOSIS — E7849 Other hyperlipidemia: Secondary | ICD-10-CM

## 2019-03-25 DIAGNOSIS — E7800 Pure hypercholesterolemia, unspecified: Secondary | ICD-10-CM

## 2019-03-25 DIAGNOSIS — R058 Other specified cough: Secondary | ICD-10-CM

## 2019-03-25 DIAGNOSIS — R05 Cough: Secondary | ICD-10-CM

## 2019-03-25 MED ORDER — OMEGA-3-ACID ETHYL ESTERS 1 G PO CAPS: 1.0000 g | ORAL_CAPSULE | Freq: Two times a day (BID) | ORAL | 11 refills | Status: DC

## 2019-03-25 MED ORDER — LOSARTAN POTASSIUM 100 MG PO TABS: ORAL_TABLET | ORAL | 1 refills | Status: DC

## 2019-03-25 MED ORDER — MONTELUKAST SODIUM 10 MG PO TABS: 10.0000 mg | ORAL_TABLET | Freq: Every day | ORAL | 1 refills | Status: DC

## 2019-03-25 MED ORDER — METOPROLOL SUCCINATE ER 25 MG PO TB24: 25.0000 mg | ORAL_TABLET | Freq: Every day | ORAL | 1 refills | Status: DC

## 2019-03-25 MED ORDER — LORATADINE 10 MG PO TABS: 10.0000 mg | ORAL_TABLET | Freq: Every day | ORAL | 1 refills | Status: DC

## 2019-03-25 NOTE — Progress Notes (Signed)
Date:  03/25/2019   Name:  Carol Taylor   DOB:  10-16-1961   MRN:  [id number]   Chief Complaint: Allergic Rhinitis  (add montelukast) and Hypertension  Hypertension  This is a chronic problem. The current episode started more than 1 year ago. The problem has been gradually worsening since onset. The problem is controlled. Pertinent negatives include no anxiety, blurred vision, chest pain, headaches, malaise/fatigue, neck pain, orthopnea, palpitations, peripheral edema, PND, shortness of breath or sweats. There are no associated agents to hypertension. Risk factors for coronary artery disease include dyslipidemia and obesity. The current treatment provides moderate improvement. There are no compliance problems.  There is no history of angina, kidney disease, CAD/MI, CVA, heart failure, left ventricular hypertrophy or PVD. There is no history of chronic renal disease, a hypertension causing med or renovascular disease.  Hyperlipidemia  This is a chronic problem. The current episode started more than 1 year ago. The problem is controlled. Recent lipid tests were reviewed and are normal. She has no history of chronic renal disease. Factors aggravating her hyperlipidemia include smoking. Pertinent negatives include no chest pain, myalgias or shortness of breath. Treatments tried: lovasa. The current treatment provides moderate improvement of lipids. There are no compliance problems.  Risk factors for coronary artery disease include dyslipidemia.  Cough  This is a recurrent problem. The current episode started 1 to 4 weeks ago. The problem has been waxing and waning. The cough is non-productive (AM). Associated symptoms include nasal congestion and postnasal drip. Pertinent negatives include no chest pain, chills, ear pain, eye redness, fever, headaches, myalgias, rash, rhinorrhea, sore throat, shortness of breath, sweats or wheezing. The symptoms are aggravated by pollens. There is no history of  environmental allergies.    Review of Systems  Constitutional: Negative.  Negative for chills, fatigue, fever, malaise/fatigue and unexpected weight change.  HENT: Positive for postnasal drip. Negative for congestion, ear discharge, ear pain, rhinorrhea, sinus pressure, sneezing and sore throat.   Eyes: Negative for blurred vision, photophobia, pain, discharge, redness and itching.  Respiratory: Positive for cough. Negative for shortness of breath, wheezing and stridor.   Cardiovascular: Negative for chest pain, palpitations, orthopnea and PND.  Gastrointestinal: Negative for abdominal pain, blood in stool, constipation, diarrhea, nausea and vomiting.  Endocrine: Negative for cold intolerance, heat intolerance, polydipsia, polyphagia and polyuria.  Genitourinary: Negative for dysuria, flank pain, frequency, hematuria, menstrual problem, pelvic pain, urgency, vaginal bleeding and vaginal discharge.  Musculoskeletal: Negative for arthralgias, back pain, myalgias and neck pain.  Skin: Negative for rash.  Allergic/Immunologic: Negative for environmental allergies and food allergies.  Neurological: Negative for dizziness, weakness, light-headedness, numbness and headaches.  Hematological: Negative for adenopathy. Does not bruise/bleed easily.  Psychiatric/Behavioral: Negative for dysphoric mood. The patient is not nervous/anxious.     Patient Active Problem List   Diagnosis Date Noted  . Pure hypercholesterolemia 06/13/2017  . Chronic seasonal allergic rhinitis due to pollen 10/01/2016  . Essential hypertension 10/18/2015  . Familial multiple lipoprotein-type hyperlipidemia 04/14/2015  . Routine general medical examination at a health care facility 04/14/2015  . H/O: HTN (hypertension) 04/14/2015  . Cigarette nicotine dependence without complication 04/14/2015  . Overweight (BMI 25.0-29.9) 04/14/2015    Allergies  Allergen Reactions  . Sulfa Antibiotics Hives    Past Surgical History:   Procedure Laterality Date  . TUBAL LIGATION      Social History   Tobacco Use  . Smoking status: Current Every Day Smoker    Types: Cigarettes  .  Smokeless tobacco: Never Used  . Tobacco comment: patches and pills   Substance Use Topics  . Alcohol use: No    Alcohol/week: 0.0 standard drinks  . Drug use: No     Medication list has been reviewed and updated.  Current Meds  Medication Sig  . aspirin 81 MG tablet Take 1 tablet (81 mg total) by mouth 1 day or 1 dose.  . loratadine (CLARITIN) 10 MG tablet Take 1 tablet (10 mg total) by mouth daily.  Marland Kitchen. losartan (COZAAR) 100 MG tablet TAKE 1/2 TABLET (50MG ) BY MOUTH EVERY DAY  . metoprolol succinate (TOPROL-XL) 25 MG 24 hr tablet Take 1 tablet (25 mg total) by mouth daily.  . Multiple Vitamins-Minerals (WOMENS MULTIVITAMIN PLUS) TABS Take 1 tablet by mouth 1 day or 1 dose.  . omega-3 acid ethyl esters (LOVAZA) 1 g capsule Take 1 capsule (1 g total) by mouth 2 (two) times daily.  . sodium chloride (OCEAN) 0.65 % SOLN nasal spray Place 1 spray into both nostrils as needed for congestion.  . vitamin E 400 UNIT capsule Take 1 capsule by mouth 1 day or 1 dose.    PHQ 2/9 Scores 03/25/2019 09/30/2017 06/13/2017 04/12/2016  PHQ - 2 Score 0 0 0 0  PHQ- 9 Score 0 0 - -    BP Readings from Last 3 Encounters:  03/25/19 130/80  09/24/18 122/82  03/24/18 128/90    Physical Exam Vitals signs and nursing note reviewed.  Constitutional:      General: She is not in acute distress.    Appearance: She is not diaphoretic.  HENT:     Head: Normocephalic and atraumatic.     Right Ear: External ear normal.     Left Ear: External ear normal.     Nose: Nose normal.  Eyes:     General:        Right eye: No discharge.        Left eye: No discharge.     Conjunctiva/sclera: Conjunctivae normal.     Pupils: Pupils are equal, round, and reactive to light.  Neck:     Musculoskeletal: Normal range of motion and neck supple.     Thyroid: No  thyromegaly.     Vascular: No JVD.  Cardiovascular:     Rate and Rhythm: Normal rate and regular rhythm.     Heart sounds: Normal heart sounds. No murmur. No friction rub. No gallop.   Pulmonary:     Effort: Pulmonary effort is normal.     Breath sounds: Normal breath sounds.  Abdominal:     General: Bowel sounds are normal.     Palpations: Abdomen is soft. There is no mass.     Tenderness: There is no abdominal tenderness. There is no guarding.  Musculoskeletal: Normal range of motion.  Lymphadenopathy:     Cervical: No cervical adenopathy.  Skin:    General: Skin is warm and dry.  Neurological:     Mental Status: She is alert.     Deep Tendon Reflexes: Reflexes are normal and symmetric.     Wt Readings from Last 3 Encounters:  03/25/19 160 lb (72.6 kg)  09/24/18 161 lb (73 kg)  03/24/18 162 lb (73.5 kg)    BP 130/80   Pulse 80   Ht 5\' 2"  (1.575 m)   Wt 160 lb (72.6 kg)   BMI 29.26 kg/m    Assessment and Plan: 1. Essential hypertension Chronic.  Controlled.  Continue losartan 100 mg 1/2 tablet  daily and metoprolol XL 25 mg once a day.  Check renal function panel to assess GFR. - losartan (COZAAR) 100 MG tablet; TAKE 1/2 TABLET (50MG ) BY MOUTH EVERY DAY  Dispense: 45 tablet; Refill: 1 - metoprolol succinate (TOPROL-XL) 25 MG 24 hr tablet; Take 1 tablet (25 mg total) by mouth daily.  Dispense: 90 tablet; Refill: 1 - Renal Function Panel  2. Chronic seasonal allergic rhinitis due to pollen Chronic.  Controlled.  Continue loratadine 10 mg once a day and Singulair 10 mg once a day - loratadine (CLARITIN) 10 MG tablet; Take 1 tablet (10 mg total) by mouth daily.  Dispense: 90 tablet; Refill: 1 - montelukast (SINGULAIR) 10 MG tablet; Take 1 tablet (10 mg total) by mouth at bedtime.  Dispense: 90 tablet; Refill: 1 - Lipid panel  3. Pure hypercholesterolemia Patient with history of hypercholesterolemia she is unable to take statins therefore she is only on Lovaza 1 g by  mouth 2 times a day.  Will check lipid panel. - omega-3 acid ethyl esters (LOVAZA) 1 g capsule; Take 1 capsule (1 g total) by mouth 2 (two) times daily.  Dispense: 60 capsule; Refill: 11  4. Recurrent nonproductive cough Patient has a recurrent nonproductive cough likely secondary to reactive airway disease.  Continue Singulair 10 mg once a day will check a chest x-ray to evaluate persistent cough. - montelukast (SINGULAIR) 10 MG tablet; Take 1 tablet (10 mg total) by mouth at bedtime.  Dispense: 90 tablet; Refill: 1 - DG Chest 2 View; Future  5. Cigarette nicotine dependence without complication Patient has been advised of the health risks of smoking and counseled concerning cessation of tobacco products. I spent over 3 minutes for discussion and to answer questions.  6. Familial multiple lipoprotein-type hyperlipidemia As noted above patient will continue Lovaza. - Lipid panel  7. Overweight (BMI 25.0-29.9) Health risks of being over weight were discussed and patient was counseled on weight loss options and exercise.

## 2019-03-26 ENCOUNTER — Other Ambulatory Visit: Payer: Self-pay

## 2019-03-26 DIAGNOSIS — E7800 Pure hypercholesterolemia, unspecified: Secondary | ICD-10-CM

## 2019-03-26 LAB — RENAL FUNCTION PANEL
Albumin: 4.5 g/dL (ref 3.8–4.9)
BUN/Creatinine Ratio: 13 (ref 9–23)
BUN: 8 mg/dL (ref 6–24)
CO2: 23 mmol/L (ref 20–29)
Calcium: 9.5 mg/dL (ref 8.7–10.2)
Chloride: 101 mmol/L (ref 96–106)
Creatinine, Ser: 0.62 mg/dL (ref 0.57–1.00)
GFR calc Af Amer: 114 mL/min/{1.73_m2} (ref >59)
GFR calc non Af Amer: 99 mL/min/{1.73_m2} (ref >59)
Glucose: 97 mg/dL (ref 65–99)
Phosphorus: 3.5 mg/dL (ref 3.0–4.3)
Potassium: 4.1 mmol/L (ref 3.5–5.2)
Sodium: 137 mmol/L (ref 134–144)

## 2019-03-26 LAB — LIPID PANEL
Chol/HDL Ratio: 5 ratio — ABNORMAL HIGH (ref 0.0–4.4)
Cholesterol, Total: 248 mg/dL — ABNORMAL HIGH (ref 100–199)
HDL: 50 mg/dL (ref >39)
LDL Calculated: 167 mg/dL — ABNORMAL HIGH (ref 0–99)
Triglycerides: 157 mg/dL — ABNORMAL HIGH (ref 0–149)
VLDL Cholesterol Cal: 31 mg/dL (ref 5–40)

## 2019-03-26 MED ORDER — EZETIMIBE 10 MG PO TABS: 10.0000 mg | ORAL_TABLET | Freq: Every day | ORAL | 1 refills | Status: DC

## 2019-04-14 ENCOUNTER — Other Ambulatory Visit: Payer: Self-pay | Admitting: Family Medicine

## 2019-04-14 DIAGNOSIS — I10 Essential (primary) hypertension: Secondary | ICD-10-CM

## 2019-04-23 ENCOUNTER — Other Ambulatory Visit: Payer: Self-pay | Admitting: Family Medicine

## 2019-04-23 DIAGNOSIS — E7800 Pure hypercholesterolemia, unspecified: Secondary | ICD-10-CM

## 2019-04-27 ENCOUNTER — Other Ambulatory Visit: Payer: Self-pay | Admitting: Family Medicine

## 2019-04-27 DIAGNOSIS — E7800 Pure hypercholesterolemia, unspecified: Secondary | ICD-10-CM

## 2019-07-18 ENCOUNTER — Other Ambulatory Visit: Payer: Self-pay | Admitting: Family Medicine

## 2019-07-18 DIAGNOSIS — I10 Essential (primary) hypertension: Secondary | ICD-10-CM

## 2019-08-19 ENCOUNTER — Telehealth: Payer: Self-pay

## 2019-08-19 NOTE — Telephone Encounter (Signed)
Patient called saying she is experiencing foot pain that she thinks plantar fascitis. Wants to know if she should see a orthopedic doctor.  Called pt and left VM informing she will need to see Dr Ronnald Ramp first to discuss being referred to a podiatrist. Told her to call the office back and schedule an appt for foot pain per Baxter Flattery.

## 2019-08-22 ENCOUNTER — Other Ambulatory Visit: Payer: Self-pay | Admitting: Family Medicine

## 2019-08-22 DIAGNOSIS — I10 Essential (primary) hypertension: Secondary | ICD-10-CM

## 2019-09-19 ENCOUNTER — Other Ambulatory Visit: Payer: Self-pay | Admitting: Family Medicine

## 2019-09-19 DIAGNOSIS — I10 Essential (primary) hypertension: Secondary | ICD-10-CM

## 2019-09-23 ENCOUNTER — Ambulatory Visit (INDEPENDENT_AMBULATORY_CARE_PROVIDER_SITE_OTHER): Payer: BC Managed Care – PPO | Admitting: Family Medicine

## 2019-09-23 ENCOUNTER — Other Ambulatory Visit: Payer: Self-pay

## 2019-09-23 ENCOUNTER — Encounter: Payer: Self-pay | Admitting: Family Medicine

## 2019-09-23 VITALS — BP 130/76 | HR 64 | Ht 62.0 in | Wt 161.0 lb

## 2019-09-23 DIAGNOSIS — J301 Allergic rhinitis due to pollen: Secondary | ICD-10-CM | POA: Diagnosis not present

## 2019-09-23 DIAGNOSIS — M722 Plantar fascial fibromatosis: Secondary | ICD-10-CM

## 2019-09-23 DIAGNOSIS — E7800 Pure hypercholesterolemia, unspecified: Secondary | ICD-10-CM

## 2019-09-23 DIAGNOSIS — I10 Essential (primary) hypertension: Secondary | ICD-10-CM

## 2019-09-23 DIAGNOSIS — Z23 Encounter for immunization: Secondary | ICD-10-CM

## 2019-09-23 MED ORDER — METOPROLOL SUCCINATE ER 25 MG PO TB24: 25.0000 mg | ORAL_TABLET | Freq: Every day | ORAL | 1 refills | Status: DC

## 2019-09-23 MED ORDER — LOSARTAN POTASSIUM 100 MG PO TABS: ORAL_TABLET | ORAL | 1 refills | Status: DC

## 2019-09-23 MED ORDER — MELOXICAM 7.5 MG PO TABS: 7.5000 mg | ORAL_TABLET | Freq: Every day | ORAL | 0 refills | Status: DC

## 2019-09-23 MED ORDER — LORATADINE 10 MG PO TABS: 10.0000 mg | ORAL_TABLET | Freq: Every day | ORAL | 1 refills | Status: DC

## 2019-09-23 NOTE — Patient Instructions (Signed)
Plantar Fasciitis  Plantar fasciitis is a painful foot condition that affects the heel. It occurs when the band of tissue that connects the toes to the heel bone (plantar fascia) becomes irritated. This can happen as the result of exercising too much or doing other repetitive activities (overuse injury). The pain from plantar fasciitis can range from mild irritation to severe pain that makes it difficult to walk or move. The pain is usually worse in the morning after sleeping, or after sitting or lying down for a while. Pain may also be worse after long periods of walking or standing. What are the causes? This condition may be caused by:  Standing for long periods of time.  Wearing shoes that do not have good arch support.  Doing activities that put stress on joints (high-impact activities), including running, aerobics, and ballet.  Being overweight.  An abnormal way of walking (gait).  Tight muscles in the back of your lower leg (calf).  High arches in your feet.  Starting a new athletic activity. What are the signs or symptoms? The main symptom of this condition is heel pain. Pain may:  Be worse with first steps after a time of rest, especially in the morning after sleeping or after you have been sitting or lying down for a while.  Be worse after long periods of standing still.  Decrease after 30-45 minutes of activity, such as gentle walking. How is this diagnosed? This condition may be diagnosed based on your medical history and your symptoms. Your health care provider may ask questions about your activity level. Your health care provider will do a physical exam to check for:  A tender area on the bottom of your foot.  A high arch in your foot.  Pain when you move your foot.  Difficulty moving your foot. You may have imaging tests to confirm the diagnosis, such as:  X-rays.  Ultrasound.  MRI. How is this treated? Treatment for plantar fasciitis depends on how  severe your condition is. Treatment may include:  Rest, ice, applying pressure (compression), and raising the affected foot (elevation). This may be called RICE therapy. Your health care provider may recommend RICE therapy along with over-the-counter pain medicines to manage your pain.  Exercises to stretch your calves and your plantar fascia.  A splint that holds your foot in a stretched, upward position while you sleep (night splint).  Physical therapy to relieve symptoms and prevent problems in the future.  Injections of steroid medicine (cortisone) to relieve pain and inflammation.  Stimulating your plantar fascia with electrical impulses (extracorporeal shock wave therapy). This is usually the last treatment option before surgery.  Surgery, if other treatments have not worked after 12 months. Follow these instructions at home:  Managing pain, stiffness, and swelling  If directed, put ice on the painful area: ? Put ice in a plastic bag, or use a frozen bottle of water. ? Place a towel between your skin and the bag or bottle. ? Roll the bottom of your foot over the bag or bottle. ? Do this for 20 minutes, 2-3 times a day.  Wear athletic shoes that have air-sole or gel-sole cushions, or try wearing soft shoe inserts that are designed for plantar fasciitis.  Raise (elevate) your foot above the level of your heart while you are sitting or lying down. Activity  Avoid activities that cause pain. Ask your health care provider what activities are safe for you.  Do physical therapy exercises and stretches as told   by your health care provider.  Try activities and forms of exercise that are easier on your joints (low-impact). Examples include swimming, water aerobics, and biking. General instructions  Take over-the-counter and prescription medicines only as told by your health care provider.  Wear a night splint while sleeping, if told by your health care provider. Loosen the splint  if your toes tingle, become numb, or turn cold and blue.  Maintain a healthy weight, or work with your health care provider to lose weight as needed.  Keep all follow-up visits as told by your health care provider. This is important. Contact a health care provider if you:  Have symptoms that do not go away after caring for yourself at home.  Have pain that gets worse.  Have pain that affects your ability to move or do your daily activities. Summary  Plantar fasciitis is a painful foot condition that affects the heel. It occurs when the band of tissue that connects the toes to the heel bone (plantar fascia) becomes irritated.  The main symptom of this condition is heel pain that may be worse after exercising too much or standing still for a long time.  Treatment varies, but it usually starts with rest, ice, compression, and elevation (RICE therapy) and over-the-counter medicines to manage pain. This information is not intended to replace advice given to you by your health care provider. Make sure you discuss any questions you have with your health care provider. Document Released: 07/23/2001 Document Revised: 10/10/2017 Document Reviewed: 08/25/2017 Elsevier Patient Education  2020 [employer]  

## 2019-09-23 NOTE — Progress Notes (Signed)
Date:  09/23/2019   Name:  Carol Taylor   DOB:  [birth year]   MRN:  [id number]   Chief Complaint: Hypertension, Hyperlipidemia, Allergic Rhinitis , and Flu Vaccine  Hypertension This is a chronic problem. The current episode started more than 1 year ago. The problem is unchanged. The problem is controlled. Pertinent negatives include no anxiety, blurred vision, chest pain, headaches, malaise/fatigue, neck pain, orthopnea, palpitations, peripheral edema, PND, shortness of breath or sweats. There are no associated agents to hypertension. Risk factors for coronary artery disease include diabetes mellitus and dyslipidemia. Past treatments include angiotensin blockers and beta blockers. The current treatment provides moderate improvement. There are no compliance problems.  There is no history of angina, kidney disease, CAD/MI, CVA, heart failure, left ventricular hypertrophy or PVD. There is no history of chronic renal disease, a hypertension causing med or renovascular disease.  Hyperlipidemia This is a chronic problem. The current episode started more than 1 year ago. The problem is controlled. Recent lipid tests were reviewed and are normal. She has no history of chronic renal disease, diabetes, hypothyroidism or liver disease. Factors aggravating her hyperlipidemia include smoking. Pertinent negatives include no chest pain, focal sensory loss, focal weakness, leg pain, myalgias or shortness of breath. Current antihyperlipidemic treatment includes ezetimibe (not taking). The current treatment provides no improvement of lipids. Compliance problems include medication side effects.  Risk factors for coronary artery disease include dyslipidemia and hypertension.  Foot Injury  The incident occurred more than 1 week ago (2-3 months). Injury mechanism: stepped on doorframe. The pain is present in the left heel. The quality of the pain is described as aching. The pain is moderate. The pain has been  constant since onset.    Lab Results  Component Value Date   CREATININE 0.62 03/25/2019   BUN 8 03/25/2019   NA 137 03/25/2019   K 4.1 03/25/2019   CL 101 03/25/2019   CO2 23 03/25/2019   Lab Results  Component Value Date   CHOL 248 (H) 03/25/2019   HDL 50 03/25/2019   LDLCALC 167 (H) 03/25/2019   TRIG 157 (H) 03/25/2019   CHOLHDL 5.0 (H) 03/25/2019   No results found for: TSH No results found for: HGBA1C   Review of Systems  Constitutional: Negative for chills, fever and malaise/fatigue.  HENT: Negative for drooling, ear discharge, ear pain and sore throat.   Eyes: Negative for blurred vision.  Respiratory: Negative for cough, shortness of breath and wheezing.   Cardiovascular: Negative for chest pain, palpitations, orthopnea, leg swelling and PND.  Gastrointestinal: Negative for abdominal pain, blood in stool, constipation, diarrhea and nausea.  Endocrine: Negative for polydipsia.  Genitourinary: Negative for dysuria, frequency, hematuria and urgency.  Musculoskeletal: Negative for back pain, myalgias and neck pain.  Skin: Negative for rash.  Allergic/Immunologic: Negative for environmental allergies.  Neurological: Negative for dizziness, focal weakness and headaches.  Hematological: Does not bruise/bleed easily.  Psychiatric/Behavioral: Negative for suicidal ideas. The patient is not nervous/anxious.     Patient Active Problem List   Diagnosis Date Noted  . Pure hypercholesterolemia 06/13/2017  . Chronic seasonal allergic rhinitis due to pollen 10/01/2016  . Essential hypertension 10/18/2015  . Familial multiple lipoprotein-type hyperlipidemia 04/14/2015  . Routine general medical examination at a health care facility 04/14/2015  . H/O: HTN (hypertension) 04/14/2015  . Cigarette nicotine dependence without complication 82/99/3716  . Overweight (BMI 25.0-29.9) 04/14/2015    Allergies  Allergen Reactions  . Sulfa Antibiotics Hives  Past Surgical History:   Procedure Laterality Date  . TUBAL LIGATION      Social History   Tobacco Use  . Smoking status: Current Every Day Smoker    Types: Cigarettes  . Smokeless tobacco: Never Used  . Tobacco comment: patches and pills   Substance Use Topics  . Alcohol use: No    Alcohol/week: 0.0 standard drinks  . Drug use: No     Medication list has been reviewed and updated.  Current Meds  Medication Sig  . aspirin 81 MG tablet Take 1 tablet (81 mg total) by mouth 1 day or 1 dose.  . loratadine (CLARITIN) 10 MG tablet Take 1 tablet (10 mg total) by mouth daily.  Marland Kitchen. losartan (COZAAR) 100 MG tablet TAKE 1/2 TABLET (50MG ) BY MOUTH EVERY DAY  . metoprolol succinate (TOPROL-XL) 25 MG 24 hr tablet Take 1 tablet (25 mg total) by mouth daily.  . Multiple Vitamins-Minerals (WOMENS MULTIVITAMIN PLUS) TABS Take 1 tablet by mouth 1 day or 1 dose.  . omega-3 acid ethyl esters (LOVAZA) 1 g capsule Take 1 capsule (1 g total) by mouth 2 (two) times daily.  . sodium chloride (OCEAN) 0.65 % SOLN nasal spray Place 1 spray into both nostrils as needed for congestion.  . vitamin E 400 UNIT capsule Take 1 capsule by mouth 1 day or 1 dose.    PHQ 2/9 Scores 09/23/2019 03/25/2019 09/30/2017 06/13/2017  PHQ - 2 Score 0 0 0 0  PHQ- 9 Score 0 0 0 -    BP Readings from Last 3 Encounters:  09/23/19 130/76  03/25/19 130/80  09/24/18 122/82    Physical Exam Vitals signs and nursing note reviewed.  Constitutional:      Appearance: She is well-developed.  HENT:     Head: Normocephalic.     Right Ear: Tympanic membrane, ear canal and external ear normal.     Left Ear: Tympanic membrane, ear canal and external ear normal.     Nose: Nose normal. No congestion or rhinorrhea.  Eyes:     General: Lids are everted, no foreign bodies appreciated. No scleral icterus.       Left eye: No foreign body or hordeolum.     Conjunctiva/sclera: Conjunctivae normal.     Right eye: Right conjunctiva is not injected.     Left eye:  Left conjunctiva is not injected.     Pupils: Pupils are equal, round, and reactive to light.  Neck:     Musculoskeletal: Normal range of motion and neck supple.     Thyroid: No thyromegaly.     Vascular: No JVD.     Trachea: No tracheal deviation.  Cardiovascular:     Rate and Rhythm: Normal rate and regular rhythm.     Heart sounds: Normal heart sounds. No murmur. No friction rub. No gallop.   Pulmonary:     Effort: Pulmonary effort is normal. No respiratory distress.     Breath sounds: Normal breath sounds. No wheezing, rhonchi or rales.  Abdominal:     General: Bowel sounds are normal.     Palpations: Abdomen is soft. There is no mass.     Tenderness: There is no abdominal tenderness. There is no guarding or rebound.  Musculoskeletal: Normal range of motion.        General: No tenderness.  Lymphadenopathy:     Cervical: No cervical adenopathy.  Skin:    General: Skin is warm.     Capillary Refill: Capillary refill takes less  than 2 seconds.     Findings: No rash.  Neurological:     Mental Status: She is alert and oriented to person, place, and time.     Cranial Nerves: No cranial nerve deficit.     Deep Tendon Reflexes: Reflexes normal.  Psychiatric:        Mood and Affect: Mood is not anxious or depressed.     Wt Readings from Last 3 Encounters:  09/23/19 161 lb (73 kg)  03/25/19 160 lb (72.6 kg)  09/24/18 161 lb (73 kg)    BP 130/76   Pulse 64   Ht 5\' 2"  (1.575 m)   Wt 161 lb (73 kg)   BMI 29.45 kg/m   Assessment and Plan:  1. Chronic seasonal allergic rhinitis due to pollen Chronic.  Controlled.  Continue Claritin 10 mg and Singulair 10 mg daily. - loratadine (CLARITIN) 10 MG tablet; Take 1 tablet (10 mg total) by mouth daily.  Dispense: 90 tablet; Refill: 1  2. Essential hypertension Chronic.  Controlled.  Stable.  Continue losartan 100 mg 1/2 tablet a day and metoprolol XL 25 mg once a day will check renal function panel.  Will recheck in 6 months. -  losartan (COZAAR) 100 MG tablet; 1/2 tablet daily  Dispense: 45 tablet; Refill: 1 - metoprolol succinate (TOPROL-XL) 25 MG 24 hr tablet; Take 1 tablet (25 mg total) by mouth daily.  Dispense: 90 tablet; Refill: 1 - Renal Function Panel  3. Pure hypercholesterolemia Chronic.  Controlled.  Stable.  Will continue dietary control may need to use atorvastatin will check lipid panel and adjust accordingly. - Lipid Panel With LDL/HDL Ratio  4. Plantar fasciitis New problem patient has over the past weeks had issues when she stepped on a door frame barefooted and probably stretched the plantar aspect of her foot.  Patient has had continued discomfort since then exam is consistent with plantar fasciitis and we will initiate Mobic 7.5 mg once a day. - meloxicam (MOBIC) 7.5 MG tablet; Take 1 tablet (7.5 mg total) by mouth daily.  Dispense: 30 tablet; Refill: 0  5. Influenza vaccine needed Discussed and administered - Flu Vaccine QUAD 6+ mos PF IM (Fluarix Quad PF)

## 2019-09-24 LAB — RENAL FUNCTION PANEL
Albumin: 4.2 g/dL (ref 3.8–4.9)
BUN/Creatinine Ratio: 11 — ABNORMAL LOW (ref 12–28)
BUN: 8 mg/dL (ref 8–27)
CO2: 23 mmol/L (ref 20–29)
Calcium: 9.1 mg/dL (ref 8.7–10.3)
Chloride: 102 mmol/L (ref 96–106)
Creatinine, Ser: 0.7 mg/dL (ref 0.57–1.00)
GFR calc Af Amer: 109 mL/min/{1.73_m2} (ref >59)
GFR calc non Af Amer: 94 mL/min/{1.73_m2} (ref >59)
Glucose: 91 mg/dL (ref 65–99)
Phosphorus: 3.2 mg/dL (ref 3.0–4.3)
Potassium: 4.4 mmol/L (ref 3.5–5.2)
Sodium: 138 mmol/L (ref 134–144)

## 2019-09-24 LAB — LIPID PANEL WITH LDL/HDL RATIO
Cholesterol, Total: 234 mg/dL — ABNORMAL HIGH (ref 100–199)
HDL: 47 mg/dL (ref >39)
LDL Chol Calc (NIH): 158 mg/dL — ABNORMAL HIGH (ref 0–99)
LDL/HDL Ratio: 3.4 ratio — ABNORMAL HIGH (ref 0.0–3.2)
Triglycerides: 157 mg/dL — ABNORMAL HIGH (ref 0–149)
VLDL Cholesterol Cal: 29 mg/dL (ref 5–40)

## 2019-10-28 ENCOUNTER — Other Ambulatory Visit: Payer: Self-pay | Admitting: Family Medicine

## 2019-10-28 DIAGNOSIS — M722 Plantar fascial fibromatosis: Secondary | ICD-10-CM

## 2020-03-21 ENCOUNTER — Ambulatory Visit: Payer: BC Managed Care – PPO | Attending: Internal Medicine

## 2020-03-21 DIAGNOSIS — Z23 Encounter for immunization: Secondary | ICD-10-CM

## 2020-03-21 NOTE — Progress Notes (Signed)
   Covid-[facility] Vaccination [facility]  Name:  Carol Taylor    MRN: [id number] DOB: 1960/12/21  03/21/2020  [name] was observed post Covid-19 immunization for 15 minutes without incident. She was provided with Vaccine Information Sheet and instruction to access the V-Safe system.   [name] was instructed to call 911 with any severe reactions post vaccine: Marland Kitchen Difficulty breathing  . Swelling of face and throat  . A fast heartbeat  . A bad rash all over body  . Dizziness and weakness   Immunizations Administered    Name Date Dose VIS Date Route   Pfizer COVID-19 Vaccine 03/21/2020 11:41 AM 0.3 mL 01/05/2019 Intramuscular   Manufacturer: [employer], [employer]   Lot: [id number]   NDC: [address]-4

## 2020-03-22 ENCOUNTER — Ambulatory Visit
Admission: RE | Admit: 2020-03-22 | Discharge: 2020-03-22 | Disposition: A | Discharge status: Home / Self Care | Payer: BC Managed Care – PPO | Source: Ambulatory Visit | Attending: Family Medicine | Admitting: Family Medicine

## 2020-03-22 ENCOUNTER — Encounter: Payer: Self-pay | Admitting: Family Medicine

## 2020-03-22 ENCOUNTER — Ambulatory Visit (INDEPENDENT_AMBULATORY_CARE_PROVIDER_SITE_OTHER): Payer: BC Managed Care – PPO | Admitting: Family Medicine

## 2020-03-22 ENCOUNTER — Ambulatory Visit
Admission: RE | Admit: 2020-03-22 | Discharge: 2020-03-22 | Disposition: A | Discharge status: Home / Self Care | Payer: BC Managed Care – PPO | Attending: Family Medicine | Admitting: Family Medicine

## 2020-03-22 ENCOUNTER — Other Ambulatory Visit: Payer: Self-pay

## 2020-03-22 VITALS — BP 120/76 | HR 80 | Ht 62.0 in | Wt 166.0 lb

## 2020-03-22 DIAGNOSIS — R058 Other specified cough: Secondary | ICD-10-CM

## 2020-03-22 DIAGNOSIS — I10 Essential (primary) hypertension: Secondary | ICD-10-CM

## 2020-03-22 DIAGNOSIS — R05 Cough: Secondary | ICD-10-CM

## 2020-03-22 DIAGNOSIS — E7849 Other hyperlipidemia: Secondary | ICD-10-CM

## 2020-03-22 DIAGNOSIS — F17210 Nicotine dependence, cigarettes, uncomplicated: Secondary | ICD-10-CM | POA: Diagnosis not present

## 2020-03-22 DIAGNOSIS — E663 Overweight: Secondary | ICD-10-CM

## 2020-03-22 MED ORDER — LOSARTAN POTASSIUM 100 MG PO TABS: ORAL_TABLET | ORAL | 1 refills | Status: DC

## 2020-03-22 MED ORDER — METOPROLOL SUCCINATE ER 25 MG PO TB24: 25.0000 mg | ORAL_TABLET | Freq: Every day | ORAL | 1 refills | Status: DC

## 2020-03-22 NOTE — Patient Instructions (Signed)

## 2020-03-22 NOTE — Progress Notes (Signed)
ren   Date:  03/22/2020   Name:  Carol Taylor   DOB:  1961/09/27   MRN:  [id number]   Chief Complaint: Hypertension, Allergic Rhinitis , and Hyperlipidemia (not taking med- need to check)  Hypertension Pertinent negatives include no headaches or shortness of breath.  Hyperlipidemia Pertinent negatives include no myalgias or shortness of breath.    Lab Results  Component Value Date   CREATININE 0.70 09/23/2019   BUN 8 09/23/2019   NA 138 09/23/2019   K 4.4 09/23/2019   CL 102 09/23/2019   CO2 23 09/23/2019   Lab Results  Component Value Date   CHOL 234 (H) 09/23/2019   HDL 47 09/23/2019   LDLCALC 158 (H) 09/23/2019   TRIG 157 (H) 09/23/2019   CHOLHDL 5.0 (H) 03/25/2019   No results found for: TSH No results found for: HGBA1C No results found for: WBC, HGB, HCT, MCV, PLT No results found for: ALT, AST, GGT, ALKPHOS, BILITOT   Review of Systems  Constitutional: Negative.  Negative for chills, fatigue, fever and unexpected weight change.  HENT: Negative for congestion, ear discharge, ear pain, rhinorrhea, sinus pressure, sneezing and sore throat.   Eyes: Negative for photophobia, pain, discharge, redness and itching.  Respiratory: Negative for cough, shortness of breath, wheezing and stridor.   Gastrointestinal: Negative for abdominal pain, blood in stool, constipation, diarrhea, nausea and vomiting.  Endocrine: Negative for cold intolerance, heat intolerance, polydipsia, polyphagia and polyuria.  Genitourinary: Negative for dysuria, flank pain, frequency, hematuria, menstrual problem, pelvic pain, urgency, vaginal bleeding and vaginal discharge.  Musculoskeletal: Negative for arthralgias, back pain and myalgias.  Skin: Negative for rash.  Allergic/Immunologic: Negative for environmental allergies and food allergies.  Neurological: Negative for dizziness, weakness, light-headedness, numbness and headaches.  Hematological: Negative for adenopathy. Does not  bruise/bleed easily.  Psychiatric/Behavioral: Negative for dysphoric mood. The patient is not nervous/anxious.     Patient Active Problem List   Diagnosis Date Noted  . Pure hypercholesterolemia 06/13/2017  . Chronic seasonal allergic rhinitis due to pollen 10/01/2016  . Essential hypertension 10/18/2015  . Familial multiple lipoprotein-type hyperlipidemia 04/14/2015  . Routine general medical examination at a health care facility 04/14/2015  . H/O: HTN (hypertension) 04/14/2015  . Cigarette nicotine dependence without complication 25/95/6387  . Overweight (BMI 25.0-29.9) 04/14/2015    Allergies  Allergen Reactions  . Sulfa Antibiotics Hives    Past Surgical History:  Procedure Laterality Date  . TUBAL LIGATION      Social History   Tobacco Use  . Smoking status: Current Every Day Smoker    Types: Cigarettes  . Smokeless tobacco: Never Used  . Tobacco comment: patches and pills   Substance Use Topics  . Alcohol use: No    Alcohol/week: 0.0 standard drinks  . Drug use: No     Medication list has been reviewed and updated.  Current Meds  Medication Sig  . aspirin 81 MG tablet Take 1 tablet (81 mg total) by mouth 1 day or 1 dose.  . loratadine (CLARITIN) 10 MG tablet Take 1 tablet (10 mg total) by mouth daily.  Marland Kitchen losartan (COZAAR) 100 MG tablet 1/2 tablet daily  . metoprolol succinate (TOPROL-XL) 25 MG 24 hr tablet Take 1 tablet (25 mg total) by mouth daily.  . montelukast (SINGULAIR) 10 MG tablet Take 1 tablet (10 mg total) by mouth at bedtime.  . Multiple Vitamins-Minerals (WOMENS MULTIVITAMIN PLUS) TABS Take 1 tablet by mouth 1 day or 1 dose.  . omega-3 acid  ethyl esters (LOVAZA) 1 g capsule Take 1 capsule (1 g total) by mouth 2 (two) times daily.  . sodium chloride (OCEAN) 0.65 % SOLN nasal spray Place 1 spray into both nostrils as needed for congestion.  . vitamin E 400 UNIT capsule Take 1 capsule by mouth 1 day or 1 dose.    PHQ 2/9 Scores 03/22/2020  09/23/2019 03/25/2019 09/30/2017  PHQ - 2 Score 0 0 0 0  PHQ- 9 Score 0 0 0 0    BP Readings from Last 3 Encounters:  03/22/20 120/76  09/23/19 130/76  03/25/19 130/80    Physical Exam Vitals and nursing note reviewed.  Constitutional:      General: She is not in acute distress.    Appearance: She is not diaphoretic.  HENT:     Head: Normocephalic and atraumatic.     Right Ear: Tympanic membrane, ear canal and external ear normal.     Left Ear: Tympanic membrane, ear canal and external ear normal.     Nose: Nose normal. No congestion or rhinorrhea.  Eyes:     General:        Right eye: No discharge.        Left eye: No discharge.     Conjunctiva/sclera: Conjunctivae normal.     Pupils: Pupils are equal, round, and reactive to light.  Neck:     Thyroid: No thyromegaly.     Vascular: No JVD.  Cardiovascular:     Rate and Rhythm: Normal rate and regular rhythm.     Heart sounds: Normal heart sounds. No murmur. No friction rub. No gallop.   Pulmonary:     Effort: Pulmonary effort is normal.     Breath sounds: Normal breath sounds.  Abdominal:     General: Bowel sounds are normal.     Palpations: Abdomen is soft. There is no mass.     Tenderness: There is no abdominal tenderness. There is no guarding.  Musculoskeletal:        General: Normal range of motion.     Cervical back: Normal range of motion and neck supple.  Lymphadenopathy:     Cervical: No cervical adenopathy.  Skin:    General: Skin is warm and dry.  Neurological:     Mental Status: She is alert.     Deep Tendon Reflexes: Reflexes are normal and symmetric.     Wt Readings from Last 3 Encounters:  03/22/20 166 lb (75.3 kg)  09/23/19 161 lb (73 kg)  03/25/19 160 lb (72.6 kg)    BP 120/76   Pulse 80   Ht 5\' 2"  (1.575 m)   Wt 166 lb (75.3 kg)   BMI 30.36 kg/m   Assessment and Plan:   1. Essential hypertension Chronic.  Controlled.  Stable.  Patient will continue losartan 100 mg 1/2 tablet daily  and metoprolol XL 25 mg once a day.  Will check renal function panel for evaluation of electrolytes and GFR. - Renal Function Panel  2. Familial multiple lipoprotein-type hyperlipidemia Ongoing battle with patient.  Patient's had elevated readings in the past and for some reason will not take statins and we have tried to prescribe Zetia for which the patient has not started because of multiple reasons.  We will recheck lipid panel more once again anticipate that we discussed Zetia 10 mg as treatment I suspect that this may not be taken as the patient does not want to take another pill. - Lipid Panel With LDL/HDL Ratio  3.  Cigarette nicotine dependence without complication Health risks of being over weight were discussed and patient was counseled on weight loss options and exercise.  4. Recurrent nonproductive cough Patient was noted to have recurrent nonproductive cough and likely to have a spiral CT for evaluation patient continues to have this cough but she is a smoker.  We will chest chest x-ray to see if there is any COPD or other concern to be noted. - DG Chest 2 View; Future  5. Overweight (BMI 25.0-29.9) Health risks of being over weight were discussed and patient was counseled on weight loss options and exercise.

## 2020-03-23 LAB — RENAL FUNCTION PANEL
Albumin: 4.3 g/dL (ref 3.8–4.9)
BUN/Creatinine Ratio: 10 — ABNORMAL LOW (ref 12–28)
BUN: 7 mg/dL — ABNORMAL LOW (ref 8–27)
CO2: 23 mmol/L (ref 20–29)
Calcium: 9.1 mg/dL (ref 8.7–10.3)
Chloride: 103 mmol/L (ref 96–106)
Creatinine, Ser: 0.71 mg/dL (ref 0.57–1.00)
GFR calc Af Amer: 107 mL/min/{1.73_m2} (ref >59)
GFR calc non Af Amer: 93 mL/min/{1.73_m2} (ref >59)
Glucose: 94 mg/dL (ref 65–99)
Phosphorus: 3.4 mg/dL (ref 3.0–4.3)
Potassium: 4.2 mmol/L (ref 3.5–5.2)
Sodium: 139 mmol/L (ref 134–144)

## 2020-03-23 LAB — LIPID PANEL WITH LDL/HDL RATIO
Cholesterol, Total: 246 mg/dL — ABNORMAL HIGH (ref 100–199)
HDL: 49 mg/dL (ref >39)
LDL Chol Calc (NIH): 171 mg/dL — ABNORMAL HIGH (ref 0–99)
LDL/HDL Ratio: 3.5 ratio — ABNORMAL HIGH (ref 0.0–3.2)
Triglycerides: 145 mg/dL (ref 0–149)
VLDL Cholesterol Cal: 26 mg/dL (ref 5–40)

## 2020-04-11 ENCOUNTER — Ambulatory Visit: Payer: BC Managed Care – PPO | Attending: Internal Medicine

## 2020-04-11 DIAGNOSIS — Z23 Encounter for immunization: Secondary | ICD-10-CM

## 2020-04-11 NOTE — Progress Notes (Signed)
   Covid-19 Vaccination Clinic  Name:  Carol Taylor    MRN: [id number] DOB: 06/16/1961  04/11/2020  [name] was observed post Covid-19 immunization for 15 minutes without incident. She was provided with Vaccine Information Sheet and instruction to access the V-Safe system.   [name] was instructed to call 911 with any severe reactions post vaccine: Marland Kitchen Difficulty breathing  . Swelling of face and throat  . A fast heartbeat  . A bad rash all over body  . Dizziness and weakness   Immunizations Administered    Name Date Dose VIS Date Route   Pfizer COVID-19 Vaccine 04/11/2020 11:02 AM 0.3 mL 01/05/2019 Intramuscular   Manufacturer: [employer], [employer]   Lot: [id number]   NDC: [address]-5

## 2020-04-19 ENCOUNTER — Other Ambulatory Visit: Payer: Self-pay | Admitting: Family Medicine

## 2020-04-19 DIAGNOSIS — J301 Allergic rhinitis due to pollen: Secondary | ICD-10-CM

## 2020-04-19 DIAGNOSIS — I10 Essential (primary) hypertension: Secondary | ICD-10-CM

## 2020-04-19 MED ORDER — METOPROLOL SUCCINATE ER 25 MG PO TB24: 25.0000 mg | ORAL_TABLET | Freq: Every day | ORAL | 1 refills | Status: DC

## 2020-04-19 MED ORDER — LORATADINE 10 MG PO TABS: 10.0000 mg | ORAL_TABLET | Freq: Every day | ORAL | 1 refills | Status: DC

## 2020-04-19 NOTE — Telephone Encounter (Signed)
Requested Prescriptions  Pending Prescriptions Disp Refills  . loratadine (CLARITIN) 10 MG tablet 90 tablet 1    Sig: Take 1 tablet (10 mg total) by mouth daily.     Ear, Nose, and Throat:  Antihistamines Passed - 04/19/2020 10:05 AM      Passed - Valid encounter within last 12 months    Recent Outpatient Visits          4 weeks ago Essential hypertension   [facility] Duanne Limerick, MD   6 months ago Essential hypertension   [facility] Duanne Limerick, MD   1 year ago Essential hypertension   [facility] Duanne Limerick, MD   1 year ago Essential hypertension   [facility] Duanne Limerick, MD   2 years ago Tobacco dependence   [facility] Duanne Limerick, MD      Future Appointments            In 5 months Duanne Limerick, MD [facility]           . metoprolol succinate (TOPROL-XL) 25 MG 24 hr tablet 90 tablet 1    Sig: Take 1 tablet (25 mg total) by mouth daily.     Cardiovascular:  Beta Blockers Passed - 04/19/2020 10:05 AM      Passed - Last BP in normal range    BP Readings from Last 1 Encounters:  03/22/20 120/76         Passed - Last Heart Rate in normal range    Pulse Readings from Last 1 Encounters:  03/22/20 80         Passed - Valid encounter within last 6 months    Recent Outpatient Visits          4 weeks ago Essential hypertension   [facility] Duanne Limerick, MD   6 months ago Essential hypertension   [facility] Duanne Limerick, MD   1 year ago Essential hypertension   [facility] Duanne Limerick, MD   1 year ago Essential hypertension   [facility] Duanne Limerick, MD   2 years ago Tobacco dependence   [facility] Duanne Limerick, MD      Future Appointments            In 5 months Duanne Limerick, MD [facility], [facility]

## 2020-04-19 NOTE — Telephone Encounter (Signed)
PT needs a refill  loratadine (CLARITIN) 10 MG tablet [[id number]]  metoprolol succinate (TOPROL-XL) 25 MG 24 hr tablet [[id number]] [facility] #[id number] - [place], [place] - [address] [place] [address] AT [facility] OF [address]  [address] [place] [address]  Phone: [phone] Fax: [phone]

## 2020-04-29 ENCOUNTER — Other Ambulatory Visit: Payer: Self-pay | Admitting: Family Medicine

## 2020-04-29 DIAGNOSIS — I10 Essential (primary) hypertension: Secondary | ICD-10-CM

## 2020-05-05 ENCOUNTER — Other Ambulatory Visit: Payer: Self-pay | Admitting: Family Medicine

## 2020-05-05 DIAGNOSIS — I10 Essential (primary) hypertension: Secondary | ICD-10-CM

## 2020-05-05 NOTE — Telephone Encounter (Signed)
Pt states this med metoprolol succinate (TOPROL-XL) 25 MG 24 hr tablet And loratadine (CLARITIN) 10 MG tablet  Was sent to [facility], and she has never used [facility]. Pt never picked these up on 04/19/20. Pt requesting you transfer this to   [facility] #[id number] - [place], [place] - [address] [address] AT [facility] [address] [facility] [address] Phone:  [phone]  Fax:  [phone]     Pt did understand how to do this on her own, and said easiest way is if we could do for her, please.

## 2020-06-19 ENCOUNTER — Other Ambulatory Visit: Payer: Self-pay | Admitting: Family Medicine

## 2020-06-19 ENCOUNTER — Telehealth: Payer: Self-pay | Admitting: Family Medicine

## 2020-06-19 ENCOUNTER — Other Ambulatory Visit: Payer: Self-pay

## 2020-06-19 DIAGNOSIS — I10 Essential (primary) hypertension: Secondary | ICD-10-CM

## 2020-06-19 DIAGNOSIS — E7849 Other hyperlipidemia: Secondary | ICD-10-CM

## 2020-06-19 MED ORDER — LOSARTAN POTASSIUM 100 MG PO TABS: ORAL_TABLET | ORAL | 0 refills | Status: DC

## 2020-06-19 MED ORDER — EZETIMIBE 10 MG PO TABS: 10.0000 mg | ORAL_TABLET | Freq: Every day | ORAL | 0 refills | Status: DC

## 2020-06-19 NOTE — Telephone Encounter (Unsigned)
Copied from CRM [id number]. Topic: General - Inquiry >> Jun 19, 2020 11:19 AM Deborha Payment wrote: Reason for CRM: patient is requesting Delice Bison to call back regarding a medication she was prescribed back in 2020 she was suppose to try and then come in for blood work.  Patient did not know name of medication. Patient would like new prescription before her 6 month follow up  Call back [phone]

## 2020-06-19 NOTE — Telephone Encounter (Signed)
RX REFILL losartan (COZAAR) 100 MG tablet [[id number]]  PHARM[facility] #[id number] - [place], [place] - [address] AT [facility] OF [address] & [facility] [address] Phone:  [phone]  Fax:  [phone]

## 2020-06-19 NOTE — Progress Notes (Signed)
Refill med zetia and losartan

## 2020-06-19 NOTE — Telephone Encounter (Signed)
Sent in losartan and zetia to [facility] [place]

## 2020-09-15 ENCOUNTER — Other Ambulatory Visit: Payer: Self-pay | Admitting: Family Medicine

## 2020-09-15 DIAGNOSIS — I10 Essential (primary) hypertension: Secondary | ICD-10-CM

## 2020-09-15 DIAGNOSIS — E7849 Other hyperlipidemia: Secondary | ICD-10-CM

## 2020-09-15 MED ORDER — EZETIMIBE 10 MG PO TABS: 10.0000 mg | ORAL_TABLET | Freq: Every day | ORAL | 0 refills | Status: DC

## 2020-09-15 MED ORDER — LOSARTAN POTASSIUM 100 MG PO TABS: ORAL_TABLET | ORAL | 0 refills | Status: DC

## 2020-09-15 NOTE — Telephone Encounter (Signed)
Medication Refill - Medication: losartan (COZAAR) 100 MG tablet [[id number]]  ezetimibe (ZETIA) 10 MG tablet [[id number]]    Preferred Pharmacy (with phone number or street name):  [facility] #[id number] [facility], [place] - [address] AT [facility] OF [address]  [address] [place] [address]  Phone: [phone] Fax: [phone]  Hours: Not open 24 hours     Agent: Please be advised that RX refills may take up to 3 business days. We ask that you follow-up with your pharmacy.

## 2020-09-26 ENCOUNTER — Ambulatory Visit (INDEPENDENT_AMBULATORY_CARE_PROVIDER_SITE_OTHER): Payer: BC Managed Care – PPO | Admitting: Family Medicine

## 2020-09-26 ENCOUNTER — Encounter: Payer: Self-pay | Admitting: Family Medicine

## 2020-09-26 ENCOUNTER — Other Ambulatory Visit: Payer: Self-pay

## 2020-09-26 VITALS — BP 140/80 | HR 80 | Ht 62.0 in | Wt 162.0 lb

## 2020-09-26 DIAGNOSIS — E785 Hyperlipidemia, unspecified: Secondary | ICD-10-CM | POA: Diagnosis not present

## 2020-09-26 DIAGNOSIS — I10 Essential (primary) hypertension: Secondary | ICD-10-CM

## 2020-09-26 DIAGNOSIS — R69 Illness, unspecified: Secondary | ICD-10-CM

## 2020-09-26 DIAGNOSIS — E7849 Other hyperlipidemia: Secondary | ICD-10-CM

## 2020-09-26 DIAGNOSIS — J301 Allergic rhinitis due to pollen: Secondary | ICD-10-CM | POA: Diagnosis not present

## 2020-09-26 DIAGNOSIS — F17210 Nicotine dependence, cigarettes, uncomplicated: Secondary | ICD-10-CM | POA: Diagnosis not present

## 2020-09-26 DIAGNOSIS — Z23 Encounter for immunization: Secondary | ICD-10-CM | POA: Diagnosis not present

## 2020-09-26 MED ORDER — LORATADINE 10 MG PO TABS: 10.0000 mg | ORAL_TABLET | Freq: Every day | ORAL | 1 refills | Status: DC

## 2020-09-26 MED ORDER — METOPROLOL SUCCINATE ER 25 MG PO TB24: 25.0000 mg | ORAL_TABLET | Freq: Every day | ORAL | 1 refills | Status: DC

## 2020-09-26 MED ORDER — LOSARTAN POTASSIUM 100 MG PO TABS: ORAL_TABLET | ORAL | 1 refills | Status: DC

## 2020-09-26 MED ORDER — EZETIMIBE 10 MG PO TABS: 10.0000 mg | ORAL_TABLET | Freq: Every day | ORAL | 1 refills | Status: DC

## 2020-09-26 NOTE — Progress Notes (Addendum)
Date:  09/26/2020   Name:  Carol Taylor   DOB:  07-14-1961   MRN:  [id number]   Chief Complaint: Hyperlipidemia, Hypertension, Allergic Rhinitis , and Asthma  Hyperlipidemia This is a chronic problem. The current episode started more than 1 year ago. The problem is controlled. Recent lipid tests were reviewed and are normal. She has no history of chronic renal disease, diabetes, hypothyroidism, liver disease, obesity or nephrotic syndrome. There are no known factors aggravating her hyperlipidemia. Pertinent negatives include no chest pain, focal sensory loss, focal weakness, leg pain, myalgias or shortness of breath. Current antihyperlipidemic treatment includes statins. The current treatment provides no improvement of lipids. There are no compliance problems.  Risk factors for coronary artery disease include hypertension.  Hypertension This is a chronic problem. The current episode started more than 1 year ago. The problem has been gradually improving since onset. The problem is controlled. Pertinent negatives include no anxiety, blurred vision, chest pain, headaches, malaise/fatigue, neck pain, orthopnea, palpitations, peripheral edema, PND, shortness of breath or sweats. There are no associated agents to hypertension. There are no known risk factors for coronary artery disease. Past treatments include angiotensin blockers and beta blockers. The current treatment provides moderate improvement. There are no compliance problems.  There is no history of angina, kidney disease, CAD/MI, CVA, heart failure, left ventricular hypertrophy, PVD or retinopathy. There is no history of chronic renal disease, a hypertension causing med or renovascular disease.  Asthma There is no chest tightness, cough, difficulty breathing, frequent throat clearing, hemoptysis, hoarse voice, shortness of breath, sputum production or wheezing. This is a chronic problem. The current episode started more than 1 year ago.  The problem occurs intermittently. The problem has been gradually improving. Pertinent negatives include no appetite change, chest pain, ear congestion, ear pain, fever, headaches, heartburn, malaise/fatigue, myalgias, PND, rhinorrhea, sneezing, sore throat or sweats. Her symptoms are aggravated by URI. Her symptoms are alleviated by beta-agonist. She reports minimal improvement on treatment. Her past medical history is significant for asthma. There is no history of bronchiectasis, bronchitis, COPD, emphysema or pneumonia.    Lab Results  Component Value Date   CREATININE 0.71 03/22/2020   BUN 7 (L) 03/22/2020   NA 139 03/22/2020   K 4.2 03/22/2020   CL 103 03/22/2020   CO2 23 03/22/2020   Lab Results  Component Value Date   CHOL 246 (H) 03/22/2020   HDL 49 03/22/2020   LDLCALC 171 (H) 03/22/2020   TRIG 145 03/22/2020   CHOLHDL 5.0 (H) 03/25/2019   No results found for: TSH No results found for: HGBA1C No results found for: WBC, HGB, HCT, MCV, PLT No results found for: ALT, AST, GGT, ALKPHOS, BILITOT   Review of Systems  Constitutional: Negative.  Negative for appetite change, chills, fatigue, fever, malaise/fatigue and unexpected weight change.  HENT: Negative for congestion, ear discharge, ear pain, hoarse voice, rhinorrhea, sinus pressure, sneezing and sore throat.   Eyes: Negative for blurred vision, photophobia, pain, discharge, redness and itching.  Respiratory: Negative for cough, hemoptysis, sputum production, shortness of breath, wheezing and stridor.   Cardiovascular: Negative for chest pain, palpitations, orthopnea and PND.  Gastrointestinal: Negative for abdominal pain, blood in stool, constipation, diarrhea, heartburn, nausea and vomiting.  Endocrine: Negative for cold intolerance, heat intolerance, polydipsia, polyphagia and polyuria.  Genitourinary: Negative for dysuria, flank pain, frequency, hematuria, menstrual problem, pelvic pain, urgency, vaginal bleeding and  vaginal discharge.  Musculoskeletal: Negative for arthralgias, back pain, myalgias and neck  pain.  Skin: Negative for color change, pallor, rash and wound.  Allergic/Immunologic: Negative for environmental allergies and food allergies.  Neurological: Negative for dizziness, focal weakness, weakness, light-headedness, numbness and headaches.  Hematological: Negative for adenopathy. Does not bruise/bleed easily.  Psychiatric/Behavioral: Negative for dysphoric mood. The patient is not nervous/anxious.     Patient Active Problem List   Diagnosis Date Noted  . Pure hypercholesterolemia 06/13/2017  . Chronic seasonal allergic rhinitis due to pollen 10/01/2016  . Essential hypertension 10/18/2015  . Familial multiple lipoprotein-type hyperlipidemia 04/14/2015  . Routine general medical examination at a health care facility 04/14/2015  . H/O: HTN (hypertension) 04/14/2015  . Cigarette nicotine dependence without complication 04/14/2015  . Overweight (BMI 25.0-29.9) 04/14/2015    Allergies  Allergen Reactions  . Sulfa Antibiotics Hives    Past Surgical History:  Procedure Laterality Date  . TUBAL LIGATION      Social History   Tobacco Use  . Smoking status: Current Every Day Smoker    Types: Cigarettes  . Smokeless tobacco: Never Used  . Tobacco comment: patches and pills   Vaping Use  . Vaping Use: Never used  Substance Use Topics  . Alcohol use: No    Alcohol/week: 0.0 standard drinks  . Drug use: No     Medication list has been reviewed and updated.  Current Meds  Medication Sig  . aspirin 81 MG tablet Take 1 tablet (81 mg total) by mouth 1 day or 1 dose.  . ezetimibe (ZETIA) 10 MG tablet Take 1 tablet (10 mg total) by mouth daily.  Marland Kitchen loratadine (CLARITIN) 10 MG tablet Take 1 tablet (10 mg total) by mouth daily.  Marland Kitchen losartan (COZAAR) 100 MG tablet 1/2 tablet daily  . metoprolol succinate (TOPROL-XL) 25 MG 24 hr tablet Take 1 tablet (25 mg total) by mouth daily.  .  montelukast (SINGULAIR) 10 MG tablet Take 1 tablet (10 mg total) by mouth at bedtime.  . Multiple Vitamins-Minerals (WOMENS MULTIVITAMIN PLUS) TABS Take 1 tablet by mouth 1 day or 1 dose.  . omega-3 acid ethyl esters (LOVAZA) 1 g capsule Take 1 capsule (1 g total) by mouth 2 (two) times daily.  . sodium chloride (OCEAN) 0.65 % SOLN nasal spray Place 1 spray into both nostrils as needed for congestion.  . vitamin E 400 UNIT capsule Take 1 capsule by mouth 1 day or 1 dose.    PHQ 2/9 Scores 09/26/2020 03/22/2020 09/23/2019 03/25/2019  PHQ - 2 Score 0 0 0 0  PHQ- 9 Score 0 0 0 0    GAD 7 : Generalized Anxiety Score 09/26/2020 03/22/2020 09/23/2019  Nervous, Anxious, on Edge 0 0 0  Control/stop worrying 0 0 0  Worry too much - different things 0 0 0  Trouble relaxing 0 0 0  Restless 0 0 0  Easily annoyed or irritable 0 0 0  Afraid - awful might happen 0 0 0  Total GAD 7 Score 0 0 0    BP Readings from Last 3 Encounters:  09/26/20 140/80  03/22/20 120/76  09/23/19 130/76    Physical Exam Vitals and nursing note reviewed.  Constitutional:      Appearance: She is well-developed.  HENT:     Head: Normocephalic.     Right Ear: Tympanic membrane, ear canal and external ear normal.     Left Ear: Tympanic membrane, ear canal and external ear normal.     Nose: Nose normal.     Mouth/Throat:  Mouth: Mucous membranes are moist.  Eyes:     General: Lids are everted, no foreign bodies appreciated. No scleral icterus.       Left eye: No foreign body or hordeolum.     Conjunctiva/sclera: Conjunctivae normal.     Right eye: Right conjunctiva is not injected.     Left eye: Left conjunctiva is not injected.     Pupils: Pupils are equal, round, and reactive to light.  Neck:     Thyroid: No thyromegaly.     Vascular: No JVD.     Trachea: No tracheal deviation.  Cardiovascular:     Rate and Rhythm: Normal rate and regular rhythm.     Heart sounds: Normal heart sounds. No murmur heard.    No friction rub. No gallop.   Pulmonary:     Effort: Pulmonary effort is normal. No respiratory distress.     Breath sounds: Normal breath sounds. No wheezing, rhonchi or rales.  Abdominal:     General: Bowel sounds are normal.     Palpations: Abdomen is soft. There is no mass.     Tenderness: There is no abdominal tenderness. There is no guarding or rebound.  Musculoskeletal:        General: No tenderness. Normal range of motion.     Cervical back: Normal range of motion and neck supple.  Lymphadenopathy:     Cervical: No cervical adenopathy.  Skin:    General: Skin is warm.     Capillary Refill: Capillary refill takes less than 2 seconds.     Findings: No bruising, erythema or rash.  Neurological:     Mental Status: She is alert and oriented to person, place, and time.     Cranial Nerves: No cranial nerve deficit.     Motor: No weakness.     Deep Tendon Reflexes: Reflexes normal.  Psychiatric:        Mood and Affect: Mood normal. Mood is not anxious or depressed.        Behavior: Behavior normal.     Wt Readings from Last 3 Encounters:  09/26/20 162 lb (73.5 kg)  03/22/20 166 lb (75.3 kg)  09/23/19 161 lb (73 kg)    BP 140/80   Pulse 80   Ht 5\' 2"  (1.575 m)   Wt 162 lb (73.5 kg)   BMI 29.63 kg/m   Assessment and Plan: Patient's chart was reviewed for previous encounters.  Most recent labs, most recent imaging, and care everywhere reviewed. 1. Essential hypertension Chronic.  Controlled.  Stable.  Blood pressure 140/80 continue losartan 100 mg 1/2 tablet daily and metoprolol XL 25 mg 1 a day. - losartan (COZAAR) 100 MG tablet; 1/2 tablet daily  Dispense: 45 tablet; Refill: 1 - metoprolol succinate (TOPROL-XL) 25 MG 24 hr tablet; Take 1 tablet (25 mg total) by mouth daily.  Dispense: 90 tablet; Refill: 1  2. Familial multiple lipoprotein-type hyperlipidemia Chronic.  Controlled.  Stable.  Continue Zetia 10 mg once a day. - ezetimibe (ZETIA) 10 MG tablet; Take 1  tablet (10 mg total) by mouth daily.  Dispense: 90 tablet; Refill: 1  3. Chronic seasonal allergic rhinitis due to pollen Chronic.  Controlled.  Stable.  Continue loratadine 10 mg once a day. - loratadine (CLARITIN) 10 MG tablet; Take 1 tablet (10 mg total) by mouth daily.  Dispense: 90 tablet; Refill: 1  4. Cigarette nicotine dependence without complication Patient has been advised of the health risks of smoking and counseled concerning cessation of tobacco  products. I spent over 3 minutes for discussion and to answer questions.  5. Taking medication for chronic disease Patient recently started on Zetia and we will check hepatic function panel in that this is excreted by hepatic means. - Hepatic Function Panel (6)  6. Need for immunization against influenza Discussed and administered. - Flu Vaccine QUAD 36+ mos IM

## 2020-09-27 LAB — HEPATIC FUNCTION PANEL (6)
ALT: 16 IU/L (ref 0–32)
AST: 19 IU/L (ref 0–40)
Albumin: 4.4 g/dL (ref 3.8–4.8)
Alkaline Phosphatase: 103 IU/L (ref 44–121)
Bilirubin Total: 0.3 mg/dL (ref 0.0–1.2)
Bilirubin, Direct: <0.1 mg/dL (ref 0.00–0.40)

## 2020-09-28 LAB — LIPID PANEL WITH LDL/HDL RATIO
Cholesterol, Total: 223 mg/dL — ABNORMAL HIGH (ref 100–199)
HDL: 53 mg/dL (ref >39)
LDL Chol Calc (NIH): 152 mg/dL — ABNORMAL HIGH (ref 0–99)
LDL/HDL Ratio: 2.9 ratio (ref 0.0–3.2)
Triglycerides: 100 mg/dL (ref 0–149)
VLDL Cholesterol Cal: 18 mg/dL (ref 5–40)

## 2020-09-28 LAB — SPECIMEN STATUS REPORT

## 2020-11-07 ENCOUNTER — Other Ambulatory Visit: Payer: Self-pay | Admitting: Family Medicine

## 2020-11-07 DIAGNOSIS — I10 Essential (primary) hypertension: Secondary | ICD-10-CM

## 2020-12-16 ENCOUNTER — Other Ambulatory Visit: Payer: Self-pay | Admitting: Family Medicine

## 2020-12-16 DIAGNOSIS — E7849 Other hyperlipidemia: Secondary | ICD-10-CM

## 2021-01-14 ENCOUNTER — Other Ambulatory Visit: Payer: Self-pay | Admitting: Family Medicine

## 2021-01-14 DIAGNOSIS — J301 Allergic rhinitis due to pollen: Secondary | ICD-10-CM

## 2021-01-14 NOTE — Telephone Encounter (Signed)
Requested Prescriptions  Pending Prescriptions Disp Refills  . loratadine (CLARITIN) 10 MG tablet [Pharmacy Med Name: LORATADINE 10MG  TABLETS] 90 tablet 1    Sig: TAKE 1 TABLET BY MOUTH EVERY DAY     Ear, Nose, and Throat:  Antihistamines Passed - 01/14/2021 11:02 AM      Passed - Valid encounter within last 12 months    Recent Outpatient Visits          3 months ago Essential hypertension   [facility] 03/16/2021, MD   9 months ago Essential hypertension   [facility] Duanne Limerick, MD   1 year ago Essential hypertension   [facility] Duanne Limerick, MD   1 year ago Essential hypertension   [facility] Duanne Limerick, MD   2 years ago Essential hypertension   [facility] Duanne Limerick, MD      Future Appointments            In 2 months Duanne Limerick, MD [facility], [facility]

## 2021-03-27 ENCOUNTER — Encounter: Payer: Self-pay | Admitting: Family Medicine

## 2021-03-27 ENCOUNTER — Other Ambulatory Visit: Payer: Self-pay

## 2021-03-27 ENCOUNTER — Ambulatory Visit (INDEPENDENT_AMBULATORY_CARE_PROVIDER_SITE_OTHER): Payer: BC Managed Care – PPO | Admitting: Family Medicine

## 2021-03-27 VITALS — BP 120/70 | HR 68 | Ht 62.0 in | Wt 162.0 lb

## 2021-03-27 DIAGNOSIS — E7849 Other hyperlipidemia: Secondary | ICD-10-CM

## 2021-03-27 DIAGNOSIS — R058 Other specified cough: Secondary | ICD-10-CM

## 2021-03-27 DIAGNOSIS — I10 Essential (primary) hypertension: Secondary | ICD-10-CM

## 2021-03-27 DIAGNOSIS — J301 Allergic rhinitis due to pollen: Secondary | ICD-10-CM

## 2021-03-27 MED ORDER — EZETIMIBE 10 MG PO TABS: ORAL_TABLET | ORAL | 1 refills | Status: DC

## 2021-03-27 MED ORDER — METOPROLOL SUCCINATE ER 25 MG PO TB24: 25.0000 mg | ORAL_TABLET | Freq: Every day | ORAL | 1 refills | Status: DC

## 2021-03-27 MED ORDER — LORATADINE 10 MG PO TABS: 10.0000 mg | ORAL_TABLET | Freq: Every day | ORAL | 1 refills | Status: DC

## 2021-03-27 MED ORDER — MONTELUKAST SODIUM 10 MG PO TABS: 10.0000 mg | ORAL_TABLET | Freq: Every day | ORAL | 1 refills | Status: DC

## 2021-03-27 NOTE — Progress Notes (Signed)
Date:  03/27/2021   Name:  Carol Taylor   DOB:  31-Jan-1961   MRN:  [id number]   Chief Complaint: Allergic Rhinitis , Hypertension, and Hyperlipidemia  Hypertension This is a chronic problem. The current episode started more than 1 year ago. The problem has been gradually improving since onset. The problem is controlled. Pertinent negatives include no anxiety, blurred vision, chest pain, headaches, malaise/fatigue, neck pain, orthopnea, palpitations, peripheral edema, PND, shortness of breath or sweats. There are no associated agents to hypertension. There are no known risk factors for coronary artery disease. Past treatments include angiotensin blockers and beta blockers. The current treatment provides moderate improvement. There are no compliance problems.  There is no history of angina, kidney disease, CAD/MI, CVA, heart failure, left ventricular hypertrophy, PVD or retinopathy. There is no history of chronic renal disease, a hypertension causing med or renovascular disease.  Hyperlipidemia This is a chronic problem. The current episode started more than 1 year ago. The problem is controlled. Recent lipid tests were reviewed and are normal. She has no history of chronic renal disease, diabetes, hypothyroidism, liver disease, obesity or nephrotic syndrome. Pertinent negatives include no chest pain, focal sensory loss, focal weakness, leg pain, myalgias or shortness of breath. Current antihyperlipidemic treatment includes statins. The current treatment provides moderate improvement of lipids. There are no compliance problems.  Risk factors for coronary artery disease include hypertension and dyslipidemia.  URI  This is a recurrent (for allergic rhinitis) problem. The current episode started more than 1 year ago. The problem has been gradually improving. There has been no fever. Pertinent negatives include no abdominal pain, chest pain, congestion, coughing, diarrhea, dysuria, ear pain,  headaches, nausea, neck pain, rash, rhinorrhea, sneezing, sore throat, vomiting or wheezing. The treatment provided moderate relief.    Lab Results  Component Value Date   CREATININE 0.71 03/22/2020   BUN 7 (L) 03/22/2020   NA 139 03/22/2020   K 4.2 03/22/2020   CL 103 03/22/2020   CO2 23 03/22/2020   Lab Results  Component Value Date   CHOL 223 (H) 09/26/2020   HDL 53 09/26/2020   LDLCALC 152 (H) 09/26/2020   TRIG 100 09/26/2020   CHOLHDL 5.0 (H) 03/25/2019   No results found for: TSH No results found for: HGBA1C No results found for: WBC, HGB, HCT, MCV, PLT Lab Results  Component Value Date   ALT 16 09/26/2020   AST 19 09/26/2020   ALKPHOS 103 09/26/2020   BILITOT 0.3 09/26/2020     Review of Systems  Constitutional: Negative.  Negative for chills, fatigue, fever, malaise/fatigue and unexpected weight change.  HENT: Negative for congestion, ear discharge, ear pain, rhinorrhea, sinus pressure, sneezing and sore throat.   Eyes: Negative for blurred vision, photophobia, pain, discharge, redness and itching.  Respiratory: Negative for cough, shortness of breath, wheezing and stridor.   Cardiovascular: Negative for chest pain, palpitations, orthopnea and PND.  Gastrointestinal: Negative for abdominal pain, blood in stool, constipation, diarrhea, nausea and vomiting.  Endocrine: Negative for cold intolerance, heat intolerance, polydipsia, polyphagia and polyuria.  Genitourinary: Negative for dysuria, flank pain, frequency, hematuria, menstrual problem, pelvic pain, urgency, vaginal bleeding and vaginal discharge.  Musculoskeletal: Negative for arthralgias, back pain, myalgias and neck pain.  Skin: Negative for rash.  Allergic/Immunologic: Negative for environmental allergies and food allergies.  Neurological: Negative for dizziness, focal weakness, weakness, light-headedness, numbness and headaches.  Hematological: Negative for adenopathy. Does not bruise/bleed easily.   Psychiatric/Behavioral: Negative for dysphoric mood.  The patient is not nervous/anxious.     Patient Active Problem List   Diagnosis Date Noted  . Pure hypercholesterolemia 06/13/2017  . Chronic seasonal allergic rhinitis due to pollen 10/01/2016  . Essential hypertension 10/18/2015  . Familial multiple lipoprotein-type hyperlipidemia 04/14/2015  . Routine general medical examination at a health care facility 04/14/2015  . H/O: HTN (hypertension) 04/14/2015  . Cigarette nicotine dependence without complication 04/14/2015  . Overweight (BMI 25.0-29.9) 04/14/2015    Allergies  Allergen Reactions  . Sulfa Antibiotics Hives    Past Surgical History:  Procedure Laterality Date  . TUBAL LIGATION      Social History   Tobacco Use  . Smoking status: Current Every Day Smoker    Types: Cigarettes  . Smokeless tobacco: Never Used  . Tobacco comment: patches and pills   Vaping Use  . Vaping Use: Never used  Substance Use Topics  . Alcohol use: No    Alcohol/week: 0.0 standard drinks  . Drug use: No     Medication list has been reviewed and updated.  Current Meds  Medication Sig  . ezetimibe (ZETIA) 10 MG tablet TAKE 1 TABLET(10 MG) BY MOUTH DAILY  . loratadine (CLARITIN) 10 MG tablet TAKE 1 TABLET BY MOUTH EVERY DAY  . losartan (COZAAR) 100 MG tablet 1/2 tablet daily  . metoprolol succinate (TOPROL-XL) 25 MG 24 hr tablet Take 1 tablet (25 mg total) by mouth daily.  . montelukast (SINGULAIR) 10 MG tablet Take 1 tablet (10 mg total) by mouth at bedtime.  . sodium chloride (OCEAN) 0.65 % SOLN nasal spray Place 1 spray into both nostrils as needed for congestion.  . vitamin E 400 UNIT capsule Take 1 capsule by mouth 1 day or 1 dose.  . [DISCONTINUED] aspirin 81 MG tablet Take 1 tablet (81 mg total) by mouth 1 day or 1 dose.    PHQ 2/9 Scores 03/27/2021 09/26/2020 03/22/2020 09/23/2019  PHQ - 2 Score 0 0 0 0  PHQ- 9 Score 0 0 0 0    GAD 7 : Generalized Anxiety Score  03/27/2021 09/26/2020 03/22/2020 09/23/2019  Nervous, Anxious, on Edge 0 0 0 0  Control/stop worrying 0 0 0 0  Worry too much - different things 0 0 0 0  Trouble relaxing 0 0 0 0  Restless 0 0 0 0  Easily annoyed or irritable 0 0 0 0  Afraid - awful might happen 0 0 0 0  Total GAD 7 Score 0 0 0 0    BP Readings from Last 3 Encounters:  03/27/21 120/70  09/26/20 140/80  03/22/20 120/76    Physical Exam Vitals and nursing note reviewed.  Constitutional:      Appearance: She is well-developed.  HENT:     Head: Normocephalic.     Right Ear: Tympanic membrane, ear canal and external ear normal.     Left Ear: Tympanic membrane, ear canal and external ear normal.     Nose: Nose normal. No congestion or rhinorrhea.     Mouth/Throat:     Mouth: Mucous membranes are moist.  Eyes:     General: Lids are everted, no foreign bodies appreciated. No scleral icterus.       Left eye: No foreign body or hordeolum.     Conjunctiva/sclera: Conjunctivae normal.     Right eye: Right conjunctiva is not injected.     Left eye: Left conjunctiva is not injected.     Pupils: Pupils are equal, round, and reactive to light.  Neck:     Thyroid: No thyromegaly.     Vascular: No JVD.     Trachea: No tracheal deviation.  Cardiovascular:     Rate and Rhythm: Normal rate and regular rhythm.     Heart sounds: Normal heart sounds. No murmur heard. No friction rub. No gallop.   Pulmonary:     Effort: Pulmonary effort is normal. No respiratory distress.     Breath sounds: Normal breath sounds. No wheezing, rhonchi or rales.  Abdominal:     General: Bowel sounds are normal.     Palpations: Abdomen is soft. There is no mass.     Tenderness: There is no abdominal tenderness. There is no guarding or rebound.  Musculoskeletal:        General: No tenderness. Normal range of motion.     Cervical back: Normal range of motion and neck supple.  Lymphadenopathy:     Cervical: No cervical adenopathy.  Skin:     General: Skin is warm.     Findings: No bruising or rash.  Neurological:     Mental Status: She is alert and oriented to person, place, and time.     Cranial Nerves: No cranial nerve deficit.     Deep Tendon Reflexes: Reflexes normal.  Psychiatric:        Mood and Affect: Mood is not anxious or depressed.     Wt Readings from Last 3 Encounters:  03/27/21 162 lb (73.5 kg)  09/26/20 162 lb (73.5 kg)  03/22/20 166 lb (75.3 kg)    BP 120/70   Pulse 68   Ht 5\' 2"  (1.575 m)   Wt 162 lb (73.5 kg)   BMI 29.63 kg/m   Assessment and Plan:  1. Essential hypertension .  Controlled.  Stable.  Blood pressure 120/70.  Continue metoprolol XL 25 mg once a day.  Will check renal function panel for electrolytes and GFR. - metoprolol succinate (TOPROL-XL) 25 MG 24 hr tablet; Take 1 tablet (25 mg total) by mouth daily.  Dispense: 90 tablet; Refill: 1 - Renal Function Panel  2. Familial multiple lipoprotein-type hyperlipidemia Chronic.  Controlled.  Stable.  Continue acetamide 10 mg once a day.  Will check lipid panel for current status of LDL. - ezetimibe (ZETIA) 10 MG tablet; TAKE 1 TABLET(10 MG) BY MOUTH DAILY  Dispense: 90 tablet; Refill: 1 - Lipid Panel With LDL/HDL Ratio  3. Chronic seasonal allergic rhinitis due to pollen Chronic.  Seasonal.  Intermittent.  Currently stable.  Patient is doing well with combination Claritin 10 mg and Singulair 10 mg once a day. - loratadine (CLARITIN) 10 MG tablet; Take 1 tablet (10 mg total) by mouth daily.  Dispense: 90 tablet; Refill: 1 - montelukast (SINGULAIR) 10 MG tablet; Take 1 tablet (10 mg total) by mouth at bedtime.  Dispense: 90 tablet; Refill: 1  4. Recurrent nonproductive cough Patient does have some reactive airway disease/possible COPD and does well with Singulair 10 mg once daily. - montelukast (SINGULAIR) 10 MG tablet; Take 1 tablet (10 mg total) by mouth at bedtime.  Dispense: 90 tablet; Refill: 1

## 2021-03-28 LAB — LIPID PANEL WITH LDL/HDL RATIO
Cholesterol, Total: 222 mg/dL — ABNORMAL HIGH (ref 100–199)
HDL: 47 mg/dL (ref >39)
LDL Chol Calc (NIH): 142 mg/dL — ABNORMAL HIGH (ref 0–99)
LDL/HDL Ratio: 3 ratio (ref 0.0–3.2)
Triglycerides: 181 mg/dL — ABNORMAL HIGH (ref 0–149)
VLDL Cholesterol Cal: 33 mg/dL (ref 5–40)

## 2021-03-28 LAB — RENAL FUNCTION PANEL
Albumin: 4.2 g/dL (ref 3.8–4.8)
BUN/Creatinine Ratio: 14 (ref 12–28)
BUN: 9 mg/dL (ref 8–27)
CO2: 23 mmol/L (ref 20–29)
Calcium: 9 mg/dL (ref 8.7–10.3)
Chloride: 103 mmol/L (ref 96–106)
Creatinine, Ser: 0.64 mg/dL (ref 0.57–1.00)
Glucose: 94 mg/dL (ref 65–99)
Phosphorus: 3.1 mg/dL (ref 3.0–4.3)
Potassium: 4.3 mmol/L (ref 3.5–5.2)
Sodium: 139 mmol/L (ref 134–144)
eGFR: 100 mL/min/{1.73_m2} (ref >59)

## 2021-04-27 ENCOUNTER — Telehealth: Payer: Self-pay | Admitting: Internal Medicine

## 2021-04-27 ENCOUNTER — Ambulatory Visit: Payer: Self-pay

## 2021-04-27 NOTE — Telephone Encounter (Signed)
Patient called and says she woke up this morning with dizziness/vertigo. She says when she woke up she felt funny and when she went to get OOB, the room was spinning, she had to hold on to walk to the bathroom. She denies falling. She says at one point she bent down and felt nauseated like she was going to vomit, but it passed after sitting down. She says while sitting down no dizziness, but she stands and the dizziness/vertigo starts. She says her ears have had pressure the past few days and she takes allergy medicine every day. Denies any other symptoms. I asked if she's able to come to the office, she says she doesn't think she can due to the way she feels when standing and walking, off balance, room spinning. I advised Virtual visit today at 1620 with Dr. Judithann Graves due to no availability with PCP, care advice given, patient verbalized understanding. Advised someone will call prior to the appointment to her home number listed in the appointment notes.  Reason for Disposition  [1] MODERATE dizziness (e.g., vertigo; feels very unsteady, interferes with normal activities) AND [2] has NOT been evaluated by physician for this  Answer Assessment - Initial Assessment Questions 1. DESCRIPTION: "Describe your dizziness."     Room moving 2. VERTIGO: "Do you feel like either you or the room is spinning or tilting?"      Yes 3. LIGHTHEADED: "Do you feel lightheaded?" (e.g., somewhat faint, woozy, weak upon standing)     Yes, off balanced 4. SEVERITY: "How bad is it?"  "Can you walk?"   - MILD: Feels slightly dizzy and unsteady, but is walking normally.   - MODERATE: Feels unsteady when walking, but not falling; interferes with normal activities (e.g., school, work).   - SEVERE: Unable to walk without falling, or requires assistance to walk without falling.     Moderate 5. ONSET:  "When did the dizziness begin?"     This morning when woke up feeling strange, then had to hold on while walking 6. AGGRAVATING  FACTORS: "Does anything make it worse?" (e.g., standing, change in head position)     Standing 7. CAUSE: "What do you think is causing the dizziness?"     I don't know 8. RECURRENT SYMPTOM: "Have you had dizziness before?" If Yes, ask: "When was the last time?" "What happened that time?"     No 9. OTHER SYMPTOMS: "Do you have any other symptoms?" (e.g., headache, weakness, numbness, vomiting, earache)     Felt nauseated this morning, ear pressure past few days 10. PREGNANCY: "Is there any chance you are pregnant?" "When was your last menstrual period?"       No  Protocols used: Dizziness - Vertigo-A-AH

## 2021-04-30 ENCOUNTER — Ambulatory Visit: Payer: BC Managed Care – PPO | Admitting: Family Medicine

## 2021-06-17 ENCOUNTER — Other Ambulatory Visit: Payer: Self-pay | Admitting: Family Medicine

## 2021-06-17 DIAGNOSIS — I10 Essential (primary) hypertension: Secondary | ICD-10-CM

## 2021-06-17 NOTE — Telephone Encounter (Signed)
Requested Prescriptions  Pending Prescriptions Disp Refills  . losartan (COZAAR) 100 MG tablet [Pharmacy Med Name: LOSARTAN 100MG  TABLETS] 45 tablet 1    Sig: TAKE 1/2 TABLET BY MOUTH ONCE DAILY     Cardiovascular:  Angiotensin Receptor Blockers Passed - 06/17/2021 11:47 AM      Passed - Cr in normal range and within 180 days    Creatinine, Ser  Date Value Ref Range Status  03/27/2021 0.64 0.57 - 1.00 mg/dL Final         Passed - K in normal range and within 180 days    Potassium  Date Value Ref Range Status  03/27/2021 4.3 3.5 - 5.2 mmol/L Final         Passed - Patient is not pregnant      Passed - Last BP in normal range    BP Readings from Last 1 Encounters:  03/27/21 120/70         Passed - Valid encounter within last 6 months    Recent Outpatient Visits          2 months ago Essential hypertension   [facility] 03/29/21, MD   8 months ago Essential hypertension   [facility] Duanne Limerick, MD   1 year ago Essential hypertension   [facility] Duanne Limerick, MD   1 year ago Essential hypertension   [facility] Duanne Limerick, MD   2 years ago Essential hypertension   [facility], [place]

## 2021-06-18 ENCOUNTER — Other Ambulatory Visit: Payer: Self-pay | Admitting: Family Medicine

## 2021-06-18 DIAGNOSIS — E7849 Other hyperlipidemia: Secondary | ICD-10-CM

## 2021-06-18 DIAGNOSIS — I10 Essential (primary) hypertension: Secondary | ICD-10-CM

## 2021-08-25 IMAGING — CR DG CHEST 2V
2 series · 2 of 2 positions shown · non-contrast
Comparison: None.

CLINICAL DATA: Recurrent cough due to allergies

EXAM:
CHEST - 2 VIEW

[chest pa]
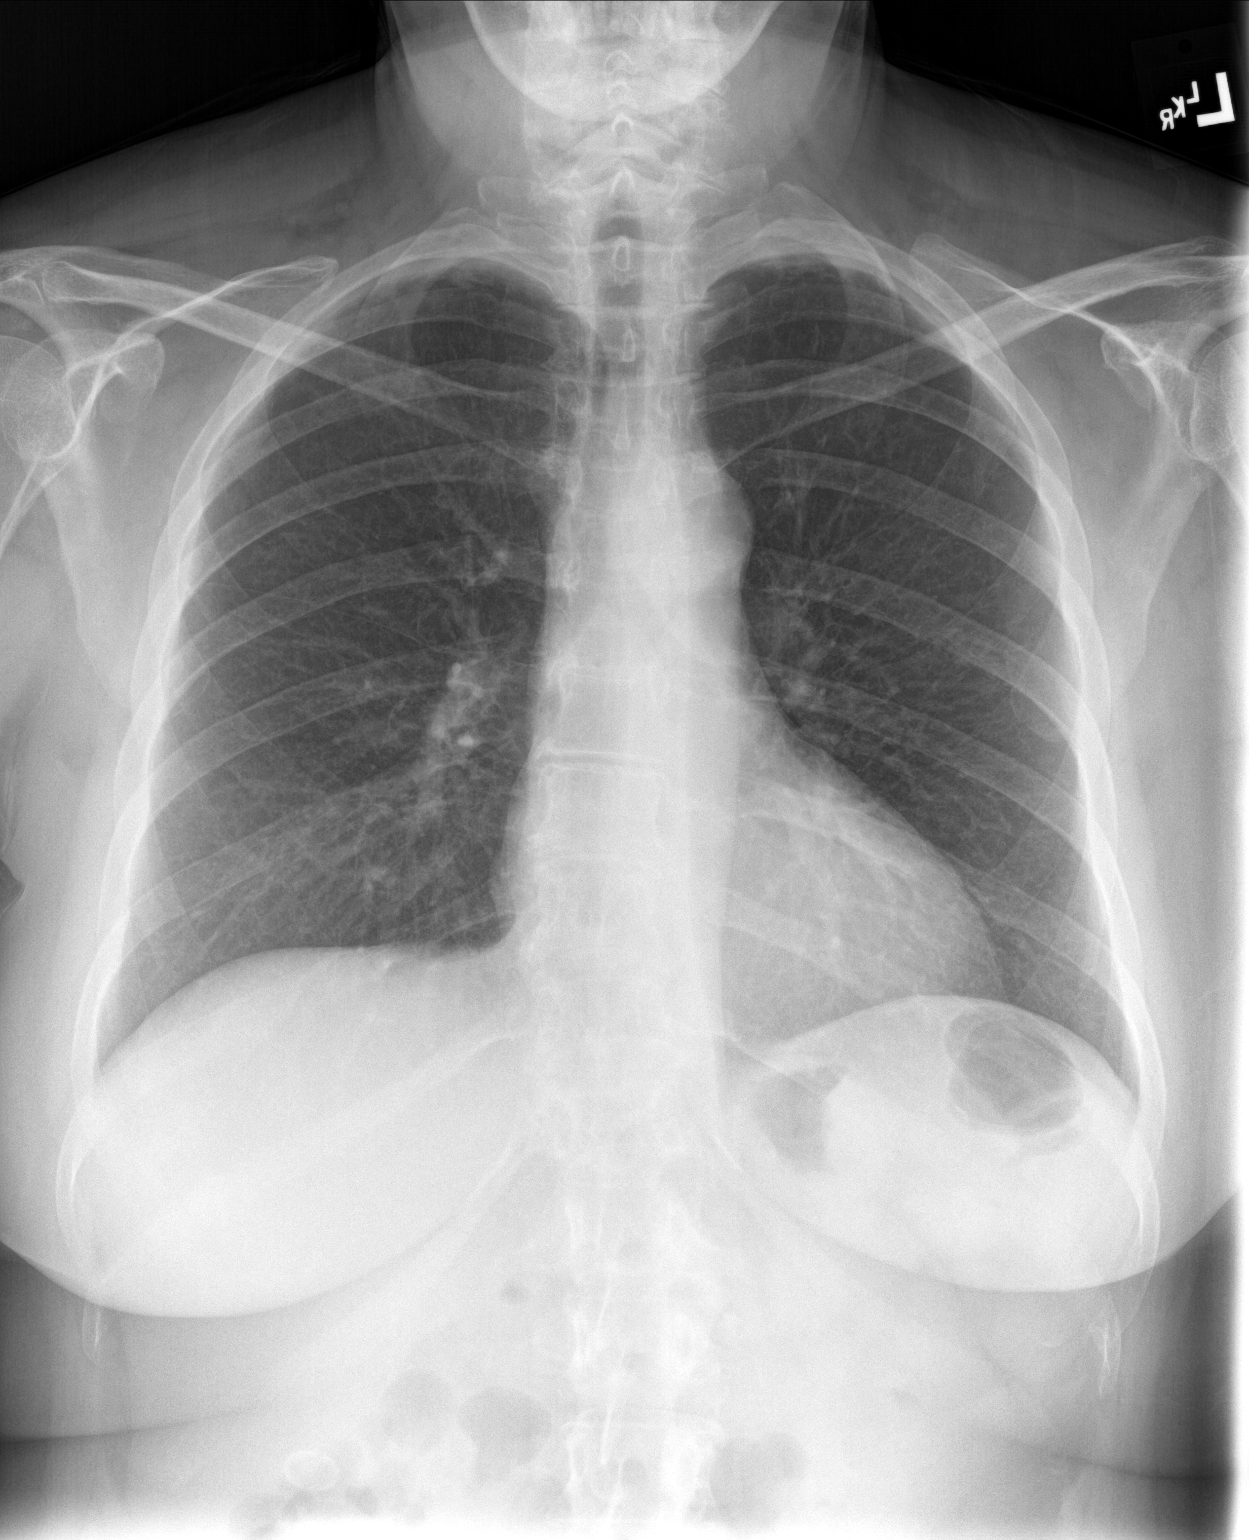

[chest lat]
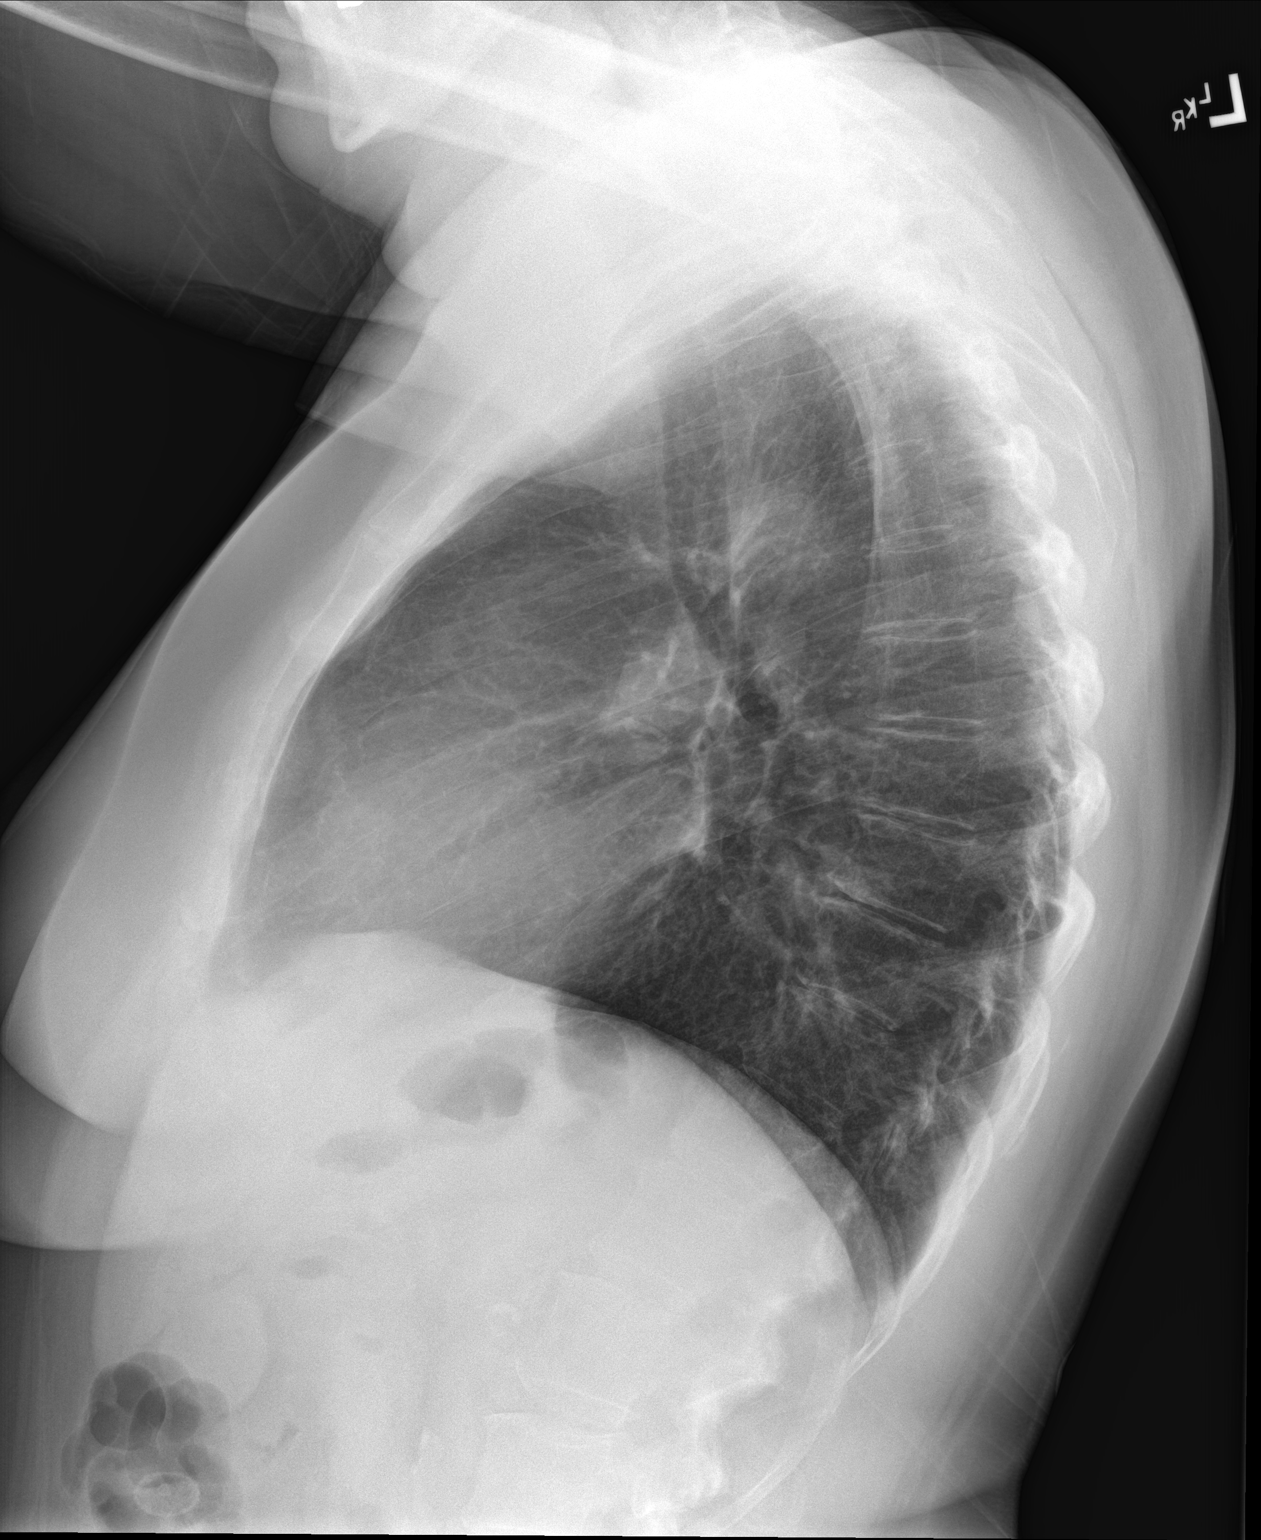

[2 of 2 positions shown; findings below may reference images not displayed]

FINDINGS: The heart size and mediastinal contours are within normal limits.
Both lungs are clear. The visualized skeletal structures are
unremarkable.
IMPRESSION: No active cardiopulmonary disease.

## 2021-11-02 ENCOUNTER — Other Ambulatory Visit: Payer: Self-pay

## 2021-11-02 DIAGNOSIS — I10 Essential (primary) hypertension: Secondary | ICD-10-CM

## 2021-11-02 MED ORDER — METOPROLOL SUCCINATE ER 25 MG PO TB24: 25.0000 mg | ORAL_TABLET | Freq: Every day | ORAL | 0 refills | Status: DC

## 2021-11-02 NOTE — Progress Notes (Signed)
Sent in 15 days of metoprolol

## 2021-11-07 ENCOUNTER — Other Ambulatory Visit: Payer: Self-pay

## 2021-11-07 ENCOUNTER — Ambulatory Visit (INDEPENDENT_AMBULATORY_CARE_PROVIDER_SITE_OTHER): Payer: BC Managed Care – PPO | Admitting: Family Medicine

## 2021-11-07 ENCOUNTER — Encounter: Payer: Self-pay | Admitting: Family Medicine

## 2021-11-07 VITALS — BP 130/80 | HR 68 | Ht 62.0 in | Wt 157.0 lb

## 2021-11-07 DIAGNOSIS — Z23 Encounter for immunization: Secondary | ICD-10-CM

## 2021-11-07 DIAGNOSIS — I10 Essential (primary) hypertension: Secondary | ICD-10-CM | POA: Diagnosis not present

## 2021-11-07 DIAGNOSIS — J301 Allergic rhinitis due to pollen: Secondary | ICD-10-CM | POA: Diagnosis not present

## 2021-11-07 DIAGNOSIS — R058 Other specified cough: Secondary | ICD-10-CM

## 2021-11-07 DIAGNOSIS — E7849 Other hyperlipidemia: Secondary | ICD-10-CM

## 2021-11-07 MED ORDER — LORATADINE 10 MG PO TABS: 10.0000 mg | ORAL_TABLET | Freq: Every day | ORAL | 1 refills | Status: DC

## 2021-11-07 MED ORDER — EZETIMIBE 10 MG PO TABS: ORAL_TABLET | ORAL | 1 refills | Status: DC

## 2021-11-07 MED ORDER — METOPROLOL SUCCINATE ER 25 MG PO TB24: 25.0000 mg | ORAL_TABLET | Freq: Every day | ORAL | 1 refills | Status: DC

## 2021-11-07 MED ORDER — LOSARTAN POTASSIUM 100 MG PO TABS: ORAL_TABLET | ORAL | 1 refills | Status: DC

## 2021-11-07 MED ORDER — MONTELUKAST SODIUM 10 MG PO TABS: 10.0000 mg | ORAL_TABLET | Freq: Every day | ORAL | 1 refills | Status: DC

## 2021-11-07 NOTE — Addendum Note (Signed)
Addended by: Everitt Amber on: 11/07/2021 09:04 AM   Modules accepted: Orders

## 2021-11-07 NOTE — Progress Notes (Signed)
Date:  11/07/2021   Name:  Carol Taylor   DOB:  1961/06/04   MRN:  [id number]   Chief Complaint: pneumonia vacc, Flu Vaccine, Hypertension, Hyperlipidemia, and Allergic Rhinitis   Hypertension This is a chronic problem. The current episode started more than 1 year ago. The problem has been gradually improving since onset. The problem is controlled. Pertinent negatives include no anxiety, blurred vision, chest pain, headaches, malaise/fatigue, neck pain, orthopnea, palpitations, peripheral edema, PND, shortness of breath or sweats. Risk factors for coronary artery disease include dyslipidemia. Past treatments include angiotensin blockers and beta blockers. The current treatment provides moderate improvement. There are no compliance problems.  There is no history of angina, kidney disease, CAD/MI, CVA, heart failure, left ventricular hypertrophy, PVD or retinopathy. There is no history of chronic renal disease, a hypertension causing med or renovascular disease.  Hyperlipidemia She has no history of chronic renal disease. Pertinent negatives include no chest pain, myalgias or shortness of breath.   Lab Results  Component Value Date   NA 139 03/27/2021   K 4.3 03/27/2021   CO2 23 03/27/2021   GLUCOSE 94 03/27/2021   BUN 9 03/27/2021   CREATININE 0.64 03/27/2021   CALCIUM 9.0 03/27/2021   EGFR 100 03/27/2021   GFRNONAA 93 03/22/2020   Lab Results  Component Value Date   CHOL 222 (H) 03/27/2021   HDL 47 03/27/2021   LDLCALC 142 (H) 03/27/2021   TRIG 181 (H) 03/27/2021   CHOLHDL 5.0 (H) 03/25/2019   No results found for: TSH No results found for: HGBA1C No results found for: WBC, HGB, HCT, MCV, PLT Lab Results  Component Value Date   ALT 16 09/26/2020   AST 19 09/26/2020   ALKPHOS 103 09/26/2020   BILITOT 0.3 09/26/2020   No results found for: 25OHVITD2, 25OHVITD3, VD25OH   Review of Systems  Constitutional:  Negative for chills, fever and malaise/fatigue.  HENT:   Negative for drooling, ear discharge, ear pain and sore throat.   Eyes:  Negative for blurred vision.  Respiratory:  Negative for cough, shortness of breath and wheezing.   Cardiovascular:  Negative for chest pain, palpitations, orthopnea, leg swelling and PND.  Gastrointestinal:  Negative for abdominal pain, blood in stool, constipation, diarrhea and nausea.  Endocrine: Negative for polydipsia.  Genitourinary:  Negative for dysuria, frequency, hematuria and urgency.  Musculoskeletal:  Negative for back pain, myalgias and neck pain.  Skin:  Negative for rash.  Allergic/Immunologic: Negative for environmental allergies.  Neurological:  Negative for dizziness and headaches.  Hematological:  Does not bruise/bleed easily.  Psychiatric/Behavioral:  Negative for suicidal ideas. The patient is not nervous/anxious.    Patient Active Problem List   Diagnosis Date Noted   Pure hypercholesterolemia 06/13/2017   Chronic seasonal allergic rhinitis due to pollen 10/01/2016   Essential hypertension 10/18/2015   Familial multiple lipoprotein-type hyperlipidemia 04/14/2015   Routine general medical examination at a health care facility 04/14/2015   H/O: HTN (hypertension) 04/14/2015   Cigarette nicotine dependence without complication 40/76/8088   Overweight (BMI 25.0-29.9) 04/14/2015    Allergies  Allergen Reactions   Sulfa Antibiotics Hives    Past Surgical History:  Procedure Laterality Date   TUBAL LIGATION      Social History   Tobacco Use   Smoking status: Every Day    Types: Cigarettes   Smokeless tobacco: Never   Tobacco comments:    patches and pills   Vaping Use   Vaping Use: Never used  Substance Use Topics   Alcohol use: No    Alcohol/week: 0.0 standard drinks   Drug use: No     Medication list has been reviewed and updated.  Current Meds  Medication Sig   ezetimibe (ZETIA) 10 MG tablet TAKE 1 TABLET(10 MG) BY MOUTH DAILY   loratadine (CLARITIN) 10 MG tablet Take  1 tablet (10 mg total) by mouth daily.   losartan (COZAAR) 100 MG tablet TAKE 1/2 TABLET BY MOUTH DAILY   metoprolol succinate (TOPROL-XL) 25 MG 24 hr tablet Take 1 tablet (25 mg total) by mouth daily.   montelukast (SINGULAIR) 10 MG tablet Take 1 tablet (10 mg total) by mouth at bedtime.   sodium chloride (OCEAN) 0.65 % SOLN nasal spray Place 1 spray into both nostrils as needed for congestion.    PHQ 2/9 Scores 11/07/2021 03/27/2021 09/26/2020 03/22/2020  PHQ - 2 Score 0 0 0 0  PHQ- 9 Score 0 0 0 0    GAD 7 : Generalized Anxiety Score 11/07/2021 03/27/2021 09/26/2020 03/22/2020  Nervous, Anxious, on Edge 0 0 0 0  Control/stop worrying 0 0 0 0  Worry too much - different things 0 0 0 0  Trouble relaxing 0 0 0 0  Restless 0 0 0 0  Easily annoyed or irritable 0 0 0 0  Afraid - awful might happen 0 0 0 0  Total GAD 7 Score 0 0 0 0  Anxiety Difficulty Not difficult at all - - -    BP Readings from Last 3 Encounters:  11/07/21 130/80  03/27/21 120/70  09/26/20 140/80    Physical Exam Vitals and nursing note reviewed.  Constitutional:      General: She is not in acute distress.    Appearance: She is not diaphoretic.  HENT:     Head: Normocephalic and atraumatic.     Right Ear: External ear normal.     Left Ear: External ear normal.     Nose: Nose normal.     Mouth/Throat:     Mouth: Mucous membranes are moist.  Eyes:     General:        Right eye: No discharge.        Left eye: No discharge.     Conjunctiva/sclera: Conjunctivae normal.     Pupils: Pupils are equal, round, and reactive to light.  Neck:     Thyroid: No thyromegaly.     Vascular: No JVD.  Cardiovascular:     Rate and Rhythm: Normal rate and regular rhythm.     Heart sounds: Normal heart sounds. No murmur heard.   No friction rub. No gallop.  Pulmonary:     Effort: Pulmonary effort is normal.     Breath sounds: Normal breath sounds. No wheezing or rhonchi.  Abdominal:     General: Bowel sounds are  normal.     Palpations: Abdomen is soft. There is no mass.     Tenderness: There is no abdominal tenderness. There is no guarding.  Musculoskeletal:        General: Normal range of motion.     Cervical back: Normal range of motion and neck supple.  Lymphadenopathy:     Cervical: No cervical adenopathy.  Skin:    General: Skin is warm and dry.  Neurological:     Mental Status: She is alert.     Deep Tendon Reflexes: Reflexes are normal and symmetric.    Wt Readings from Last 3 Encounters:  11/07/21 157 lb (71.2 kg)  03/27/21 162 lb (73.5 kg)  09/26/20 162 lb (73.5 kg)    BP 130/80    Pulse 68    Ht '5\' 2"'  (1.575 m)    Wt 157 lb (71.2 kg)    BMI 28.72 kg/m   Assessment and Plan:  1. Essential hypertension Chronic.  Controlled.  Stable.  Blood pressure is 130/80.  Continue losartan 100 mg 1/2 tablet daily and metoprolol XL 25 mg 1 a day.  Will check CMP for electrolytes and GFR. - losartan (COZAAR) 100 MG tablet; TAKE 1/2 TABLET BY MOUTH DAILY  Dispense: 45 tablet; Refill: 1 - metoprolol succinate (TOPROL-XL) 25 MG 24 hr tablet; Take 1 tablet (25 mg total) by mouth daily.  Dispense: 90 tablet; Refill: 1 - Comprehensive Metabolic Panel (CMET)  2. Familial multiple lipoprotein-type hyperlipidemia Chronic.  Controlled.  Stable.  Continue Zetia 10 mg once a day.  Will check lipid panel for current LDL status. - ezetimibe (ZETIA) 10 MG tablet; Take 1 tablet by mouth daily  Dispense: 90 tablet; Refill: 1 - Lipid Panel With LDL/HDL Ratio  3. Chronic seasonal allergic rhinitis due to pollen Chronic.  Controlled.  Seasonal rhinitis with occasional cough that may be due to reactive airway disease is controlled on combination of Singulair 10 mg once a day and loratadine 10 mg once a day. - loratadine (CLARITIN) 10 MG tablet; Take 1 tablet (10 mg total) by mouth daily.  Dispense: 90 tablet; Refill: 1 - montelukast (SINGULAIR) 10 MG tablet; Take 1 tablet (10 mg total) by mouth at bedtime.   Dispense: 90 tablet; Refill: 1  4. Recurrent nonproductive cough As noted above likely due to mild reactive airway disease secondary to her smoking history.  Patient is controlling this with Singulair 10 mg once a day. - montelukast (SINGULAIR) 10 MG tablet; Take 1 tablet (10 mg total) by mouth at bedtime.  Dispense: 90 tablet; Refill: 1

## 2021-11-08 LAB — LIPID PANEL WITH LDL/HDL RATIO
Cholesterol, Total: 177 mg/dL (ref 100–199)
HDL: 49 mg/dL (ref >39)
LDL Chol Calc (NIH): 107 mg/dL — ABNORMAL HIGH (ref 0–99)
LDL/HDL Ratio: 2.2 ratio (ref 0.0–3.2)
Triglycerides: 119 mg/dL (ref 0–149)
VLDL Cholesterol Cal: 21 mg/dL (ref 5–40)

## 2021-11-08 LAB — COMPREHENSIVE METABOLIC PANEL
ALT: 12 IU/L (ref 0–32)
AST: 18 IU/L (ref 0–40)
Albumin/Globulin Ratio: 1.5 (ref 1.2–2.2)
Albumin: 4.1 g/dL (ref 3.8–4.8)
Alkaline Phosphatase: 95 IU/L (ref 44–121)
BUN/Creatinine Ratio: 9 — ABNORMAL LOW (ref 12–28)
BUN: 7 mg/dL — ABNORMAL LOW (ref 8–27)
Bilirubin Total: <0.2 mg/dL (ref 0.0–1.2)
CO2: 25 mmol/L (ref 20–29)
Calcium: 9 mg/dL (ref 8.7–10.3)
Chloride: 104 mmol/L (ref 96–106)
Creatinine, Ser: 0.75 mg/dL (ref 0.57–1.00)
Globulin, Total: 2.8 g/dL (ref 1.5–4.5)
Glucose: 92 mg/dL (ref 70–99)
Potassium: 4.4 mmol/L (ref 3.5–5.2)
Sodium: 140 mmol/L (ref 134–144)
Total Protein: 6.9 g/dL (ref 6.0–8.5)
eGFR: 90 mL/min/{1.73_m2} (ref >59)

## 2022-02-14 ENCOUNTER — Other Ambulatory Visit: Payer: Self-pay

## 2022-05-08 ENCOUNTER — Ambulatory Visit (INDEPENDENT_AMBULATORY_CARE_PROVIDER_SITE_OTHER): Payer: BC Managed Care – PPO | Admitting: Family Medicine

## 2022-05-08 DIAGNOSIS — J301 Allergic rhinitis due to pollen: Secondary | ICD-10-CM

## 2022-05-08 DIAGNOSIS — R058 Other specified cough: Secondary | ICD-10-CM

## 2022-05-08 DIAGNOSIS — I10 Essential (primary) hypertension: Secondary | ICD-10-CM

## 2022-05-08 DIAGNOSIS — E7849 Other hyperlipidemia: Secondary | ICD-10-CM

## 2022-05-29 ENCOUNTER — Other Ambulatory Visit: Payer: Self-pay

## 2022-05-29 MED ORDER — LORATADINE 10 MG PO TABS: 10.0000 mg | ORAL_TABLET | Freq: Every day | ORAL | 0 refills | Status: DC

## 2022-05-29 MED ORDER — EZETIMIBE 10 MG PO TABS: ORAL_TABLET | ORAL | 0 refills | Status: DC

## 2022-05-29 MED ORDER — MONTELUKAST SODIUM 10 MG PO TABS: 10.0000 mg | ORAL_TABLET | Freq: Every day | ORAL | 0 refills | Status: DC

## 2022-05-29 MED ORDER — LOSARTAN POTASSIUM 100 MG PO TABS: ORAL_TABLET | ORAL | 0 refills | Status: DC

## 2022-05-29 MED ORDER — METOPROLOL SUCCINATE ER 25 MG PO TB24: 25.0000 mg | ORAL_TABLET | Freq: Every day | ORAL | 0 refills | Status: DC

## 2022-06-20 ENCOUNTER — Encounter: Payer: Self-pay | Admitting: Family Medicine

## 2022-06-20 ENCOUNTER — Ambulatory Visit (INDEPENDENT_AMBULATORY_CARE_PROVIDER_SITE_OTHER): Payer: BC Managed Care – PPO | Admitting: Family Medicine

## 2022-06-20 VITALS — BP 130/80 | HR 76 | Ht 62.0 in | Wt 152.0 lb

## 2022-06-20 DIAGNOSIS — E7849 Other hyperlipidemia: Secondary | ICD-10-CM

## 2022-06-20 DIAGNOSIS — J301 Allergic rhinitis due to pollen: Secondary | ICD-10-CM | POA: Diagnosis not present

## 2022-06-20 DIAGNOSIS — I10 Essential (primary) hypertension: Secondary | ICD-10-CM | POA: Diagnosis not present

## 2022-06-20 DIAGNOSIS — F17210 Nicotine dependence, cigarettes, uncomplicated: Secondary | ICD-10-CM

## 2022-06-20 MED ORDER — LOSARTAN POTASSIUM 100 MG PO TABS: ORAL_TABLET | ORAL | 1 refills | Status: DC

## 2022-06-20 MED ORDER — METOPROLOL SUCCINATE ER 25 MG PO TB24: 25.0000 mg | ORAL_TABLET | Freq: Every day | ORAL | 1 refills | Status: AC

## 2022-06-20 MED ORDER — EZETIMIBE 10 MG PO TABS
ORAL_TABLET | ORAL | 1 refills | Status: AC
Start: 2022-06-20 — End: ?

## 2022-06-20 MED ORDER — LORATADINE 10 MG PO TABS: 10.0000 mg | ORAL_TABLET | Freq: Every day | ORAL | 1 refills | Status: DC

## 2022-06-20 NOTE — Progress Notes (Signed)
Date:  06/20/2022   Name:  Carol Taylor   DOB:  Jun 24, 1961   MRN:  [id number]   Chief Complaint: Hypertension, Hyperlipidemia, and Allergic Rhinitis   Hypertension This is a chronic problem. The current episode started more than 1 year ago. The problem has been gradually improving since onset. The problem is controlled. Pertinent negatives include no anxiety, blurred vision, chest pain, headaches, orthopnea, palpitations or shortness of breath. There are no associated agents to hypertension. Past treatments include angiotensin blockers and beta blockers. The current treatment provides moderate improvement. There are no compliance problems.  There is no history of angina, kidney disease, CAD/MI, CVA, heart failure, left ventricular hypertrophy, PVD or retinopathy. There is no history of chronic renal disease, a hypertension causing med or renovascular disease.  Hyperlipidemia This is a chronic problem. The current episode started more than 1 year ago. The problem is controlled. Recent lipid tests were reviewed and are normal. She has no history of chronic renal disease, diabetes, hypothyroidism, liver disease, obesity or nephrotic syndrome. Factors aggravating her hyperlipidemia include thiazides. Pertinent negatives include no chest pain, focal sensory loss, focal weakness, leg pain, myalgias or shortness of breath. Current antihyperlipidemic treatment includes statins. The current treatment provides moderate improvement of lipids. There are no compliance problems.   URI  Pertinent negatives include no abdominal pain, chest pain, congestion, coughing, diarrhea, dysuria, ear pain, headaches, nausea, rash, rhinorrhea, sneezing, sore throat or wheezing.    Lab Results  Component Value Date   NA 140 11/07/2021   K 4.4 11/07/2021   CO2 25 11/07/2021   GLUCOSE 92 11/07/2021   BUN 7 (L) 11/07/2021   CREATININE 0.75 11/07/2021   CALCIUM 9.0 11/07/2021   EGFR 90 11/07/2021   GFRNONAA 93  03/22/2020   Lab Results  Component Value Date   CHOL 177 11/07/2021   HDL 49 11/07/2021   LDLCALC 107 (H) 11/07/2021   TRIG 119 11/07/2021   CHOLHDL 5.0 (H) 03/25/2019   No results found for: "TSH" No results found for: "HGBA1C" No results found for: "WBC", "HGB", "HCT", "MCV", "PLT" Lab Results  Component Value Date   ALT 12 11/07/2021   AST 18 11/07/2021   ALKPHOS 95 11/07/2021   BILITOT <0.2 11/07/2021   No results found for: "25OHVITD2", "25OHVITD3", "VD25OH"   Review of Systems  Constitutional: Negative.  Negative for chills, fatigue, fever and unexpected weight change.  HENT:  Negative for congestion, ear discharge, ear pain, rhinorrhea, sinus pressure, sneezing and sore throat.   Eyes:  Negative for blurred vision.  Respiratory:  Negative for cough, shortness of breath, wheezing and stridor.   Cardiovascular:  Negative for chest pain, palpitations and orthopnea.  Gastrointestinal:  Negative for abdominal pain, blood in stool, constipation, diarrhea and nausea.  Genitourinary:  Negative for dysuria, flank pain, frequency, hematuria, urgency and vaginal discharge.  Musculoskeletal:  Negative for arthralgias, back pain and myalgias.  Skin:  Negative for rash.  Neurological:  Negative for dizziness, focal weakness, weakness and headaches.  Hematological:  Negative for adenopathy. Does not bruise/bleed easily.  Psychiatric/Behavioral:  Negative for dysphoric mood. The patient is not nervous/anxious.     Patient Active Problem List   Diagnosis Date Noted   Pure hypercholesterolemia 06/13/2017   Chronic seasonal allergic rhinitis due to pollen 10/01/2016   Essential hypertension 10/18/2015   Familial multiple lipoprotein-type hyperlipidemia 04/14/2015   Routine general medical examination at a health care facility 04/14/2015   H/O: HTN (hypertension) 04/14/2015   Cigarette  nicotine dependence without complication 23/36/1224   Overweight (BMI 25.0-29.9) 04/14/2015     Allergies  Allergen Reactions   Sulfa Antibiotics Hives    Past Surgical History:  Procedure Laterality Date   TUBAL LIGATION      Social History   Tobacco Use   Smoking status: Every Day    Types: Cigarettes   Smokeless tobacco: Never   Tobacco comments:    patches and pills   Vaping Use   Vaping Use: Never used  Substance Use Topics   Alcohol use: No    Alcohol/week: 0.0 standard drinks of alcohol   Drug use: No     Medication list has been reviewed and updated.  Current Meds  Medication Sig   cholecalciferol (VITAMIN D3) 25 MCG (1000 UNIT) tablet Take 2,000 Units by mouth daily.   ezetimibe (ZETIA) 10 MG tablet Take 1 tablet by mouth daily   loratadine (CLARITIN) 10 MG tablet Take 1 tablet (10 mg total) by mouth daily.   losartan (COZAAR) 100 MG tablet TAKE 1/2 TABLET BY MOUTH DAILY   metoprolol succinate (TOPROL-XL) 25 MG 24 hr tablet Take 1 tablet (25 mg total) by mouth daily.   sodium chloride (OCEAN) 0.65 % SOLN nasal spray Place 1 spray into both nostrils as needed for congestion.   vitamin E 180 MG (400 UNITS) capsule Take 400 Units by mouth daily.       06/20/2022    9:02 AM 11/07/2021    8:27 AM 03/27/2021    9:20 AM 09/26/2020    9:34 AM  GAD 7 : Generalized Anxiety Score  Nervous, Anxious, on Edge 0 0 0 0  Control/stop worrying 0 0 0 0  Worry too much - different things 0 0 0 0  Trouble relaxing 0 0 0 0  Restless 0 0 0 0  Easily annoyed or irritable 0 0 0 0  Afraid - awful might happen 0 0 0 0  Total GAD 7 Score 0 0 0 0  Anxiety Difficulty Not difficult at all Not difficult at all         06/20/2022    9:02 AM 11/07/2021    8:27 AM 03/27/2021    9:20 AM  Depression screen PHQ 2/9  Decreased Interest 0 0 0  Down, Depressed, Hopeless 0 0 0  PHQ - 2 Score 0 0 0  Altered sleeping 0 0 0  Tired, decreased energy 0 0 0  Change in appetite 0 0 0  Feeling bad or failure about yourself  0 0 0  Trouble concentrating 0 0 0  Moving slowly or  fidgety/restless 0 0 0  Suicidal thoughts 0 0 0  PHQ-9 Score 0 0 0  Difficult doing work/chores Not difficult at all Not difficult at all     BP Readings from Last 3 Encounters:  06/20/22 130/80  11/07/21 130/80  03/27/21 120/70    Physical Exam Vitals and nursing note reviewed.  Constitutional:      Appearance: She is well-developed.  HENT:     Head: Normocephalic.     Right Ear: Tympanic membrane and external ear normal.     Left Ear: Tympanic membrane and external ear normal.     Nose: No congestion or rhinorrhea.     Mouth/Throat:     Pharynx: No oropharyngeal exudate or posterior oropharyngeal erythema.  Eyes:     General: Lids are everted, no foreign bodies appreciated. No scleral icterus.       Left eye: No foreign  body or hordeolum.     Conjunctiva/sclera: Conjunctivae normal.     Right eye: Right conjunctiva is not injected.     Left eye: Left conjunctiva is not injected.     Pupils: Pupils are equal, round, and reactive to light.  Neck:     Thyroid: No thyromegaly.     Vascular: No JVD.     Trachea: No tracheal deviation.  Cardiovascular:     Rate and Rhythm: Normal rate and regular rhythm.     Heart sounds: Normal heart sounds. No murmur heard.    No friction rub. No gallop.  Pulmonary:     Effort: Pulmonary effort is normal. No respiratory distress.     Breath sounds: Normal breath sounds. No wheezing, rhonchi or rales.  Abdominal:     General: Bowel sounds are normal.     Palpations: Abdomen is soft. There is no hepatomegaly, splenomegaly or mass.     Tenderness: There is no abdominal tenderness. There is no guarding or rebound.  Musculoskeletal:        General: No tenderness. Normal range of motion.     Cervical back: Normal range of motion and neck supple.  Lymphadenopathy:     Cervical: No cervical adenopathy.  Skin:    General: Skin is warm.     Findings: No rash.  Neurological:     Mental Status: She is alert and oriented to person, place, and  time.     Cranial Nerves: No cranial nerve deficit.     Deep Tendon Reflexes: Reflexes normal.  Psychiatric:        Mood and Affect: Mood is not anxious or depressed.     Wt Readings from Last 3 Encounters:  06/20/22 152 lb (68.9 kg)  11/07/21 157 lb (71.2 kg)  03/27/21 162 lb (73.5 kg)    BP 130/80   Pulse 76   Ht _0  (1.575 m)   Wt 152 lb (68.9 kg)   BMI 27.80 kg/m   Assessment and Plan: 1. Essential hypertension Chronic.  Controlled.  Stable.  Blood pressure 130/80.  Asymptomatic.  Tolerating medication well.  Continue losartan 100 mg 1/2 tablet once a day.  And metoprolol XL 25 mg once a day.  Will recheck renal function panel for electrolytes and GFR and will recheck patient in 6 months. - losartan (COZAAR) 100 MG tablet; TAKE 1/2 TABLET BY MOUTH DAILY  Dispense: 45 tablet; Refill: 1 - metoprolol succinate (TOPROL-XL) 25 MG 24 hr tablet; Take 1 tablet (25 mg total) by mouth daily.  Dispense: 90 tablet; Refill: 1 - Renal Function Panel  2. Familial multiple lipoprotein-type hyperlipidemia Chronic.  Controlled.  Stable.  Continue Zetia 10 mg once a day.  Will check lipid panel for current level of LDL control. - ezetimibe (ZETIA) 10 MG tablet; Take 1 tablet by mouth daily  Dispense: 90 tablet; Refill: 1 - Lipid Panel With LDL/HDL Ratio  3. Chronic seasonal allergic rhinitis due to pollen Chronic.  Controlled.  Stable.  Continue Claritin 10 mg - loratadine (CLARITIN) 10 MG tablet; Take 1 tablet (10 mg total) by mouth daily.  Dispense: 90 tablet; Refill: 1  4. Cigarette nicotine dependence without complication Patient has been advised of the health risks of smoking and counseled concerning cessation of tobacco products. I spent over 3 minutes for discussion and to answer questions.      Otilio Miu, MD

## 2022-06-21 LAB — RENAL FUNCTION PANEL
Albumin: 4.1 g/dL (ref 3.9–4.9)
BUN/Creatinine Ratio: 11 — ABNORMAL LOW (ref 12–28)
BUN: 7 mg/dL — ABNORMAL LOW (ref 8–27)
CO2: 22 mmol/L (ref 20–29)
Calcium: 9.2 mg/dL (ref 8.7–10.3)
Chloride: 100 mmol/L (ref 96–106)
Creatinine, Ser: 0.65 mg/dL (ref 0.57–1.00)
Glucose: 97 mg/dL (ref 70–99)
Phosphorus: 3 mg/dL (ref 3.0–4.3)
Potassium: 4 mmol/L (ref 3.5–5.2)
Sodium: 140 mmol/L (ref 134–144)
eGFR: 99 mL/min/{1.73_m2} (ref >59)

## 2022-06-21 LAB — LIPID PANEL WITH LDL/HDL RATIO
Cholesterol, Total: 199 mg/dL (ref 100–199)
HDL: 45 mg/dL (ref >39)
LDL Chol Calc (NIH): 126 mg/dL — ABNORMAL HIGH (ref 0–99)
LDL/HDL Ratio: 2.8 ratio (ref 0.0–3.2)
Triglycerides: 158 mg/dL — ABNORMAL HIGH (ref 0–149)
VLDL Cholesterol Cal: 28 mg/dL (ref 5–40)

## 2022-07-04 NOTE — Progress Notes (Signed)
Pt No Showed

## 2022-07-26 ENCOUNTER — Other Ambulatory Visit: Payer: Self-pay | Admitting: Family Medicine

## 2022-07-26 DIAGNOSIS — J301 Allergic rhinitis due to pollen: Secondary | ICD-10-CM

## 2022-11-06 ENCOUNTER — Ambulatory Visit: Payer: BC Managed Care – PPO | Admitting: Family Medicine

## 2022-12-24 ENCOUNTER — Other Ambulatory Visit: Payer: Self-pay

## 2022-12-24 DIAGNOSIS — I10 Essential (primary) hypertension: Secondary | ICD-10-CM

## 2022-12-24 MED ORDER — LOSARTAN POTASSIUM 100 MG PO TABS: ORAL_TABLET | ORAL | 0 refills | Status: DC

## 2023-01-07 ENCOUNTER — Ambulatory Visit (INDEPENDENT_AMBULATORY_CARE_PROVIDER_SITE_OTHER): Payer: BC Managed Care – PPO | Admitting: Family Medicine

## 2023-01-07 ENCOUNTER — Encounter: Payer: Self-pay | Admitting: Family Medicine

## 2023-01-07 VITALS — BP 136/88 | HR 86 | Ht 62.0 in | Wt 153.0 lb

## 2023-01-07 DIAGNOSIS — J301 Allergic rhinitis due to pollen: Secondary | ICD-10-CM

## 2023-01-07 DIAGNOSIS — I10 Essential (primary) hypertension: Secondary | ICD-10-CM

## 2023-01-07 DIAGNOSIS — E7849 Other hyperlipidemia: Secondary | ICD-10-CM | POA: Diagnosis not present

## 2023-01-07 MED ORDER — HYDROCHLOROTHIAZIDE 12.5 MG PO TABS: 12.5000 mg | ORAL_TABLET | Freq: Every day | ORAL | 0 refills | Status: AC

## 2023-01-07 MED ORDER — LOSARTAN POTASSIUM 100 MG PO TABS: ORAL_TABLET | ORAL | 1 refills | Status: AC

## 2023-01-07 MED ORDER — LORATADINE 10 MG PO TABS: ORAL_TABLET | ORAL | 1 refills | Status: AC

## 2023-01-07 NOTE — Patient Instructions (Addendum)
Managing Your Hypertension Hypertension, also called high blood pressure, is when the force of the blood pressing against the walls of the arteries is too strong. Arteries are blood vessels that carry blood from your heart throughout your body. Hypertension forces the heart to work harder to pump blood and may cause the arteries to become narrow or stiff. Understanding blood pressure readings A blood pressure reading includes a higher number over a lower number: The first, or top, number is called the systolic pressure. It is a measure of the pressure in your arteries as your heart beats. The second, or bottom number, is called the diastolic pressure. It is a measure of the pressure in your arteries as the heart relaxes. For most people, a normal blood pressure is below 120/80. Your personal target blood pressure may vary depending on your medical conditions, your age, and other factors. Blood pressure is classified into four stages. Based on your blood pressure reading, your health care provider may use the following stages to determine what type of treatment you need, if any. Systolic pressure and diastolic pressure are measured in a unit called millimeters of mercury (mmHg). Normal Systolic pressure: below 123456. Diastolic pressure: below 80. Elevated Systolic pressure: [id number]. Diastolic pressure: below 80. Hypertension stage 1DASH Eating Plan DASH stands for Dietary Approaches to Stop Hypertension. The DASH eating plan is a healthy eating plan that has been shown to: Reduce high blood pressure (hypertension). Reduce your risk for type 2 diabetes, heart disease, and stroke. Help with weight loss. What are tips for following this plan? Reading food labels Check food labels for the amount of salt (sodium) per serving. Choose foods with less than 5 percent of the Daily Value of sodium. Generally, foods with less than 300 milligrams (mg) of sodium per serving fit into this eating plan. To find  whole grains, look for the word "whole" as the first word in the ingredient list. Shopping Buy products labeled as "low-sodium" or "no salt added." Buy fresh foods. Avoid canned foods and pre-made or frozen meals. Cooking Avoid adding salt when cooking. Use salt-free seasonings or herbs instead of table salt or sea salt. Check with your health care provider or pharmacist before using salt substitutes. Do not fry foods. Cook foods using healthy methods such as baking, boiling, grilling, roasting, and broiling instead. Cook with heart-healthy oils, such as olive, canola, avocado, soybean, or sunflower oil. Meal planning  Eat a balanced diet that includes: 4 or more servings of fruits and 4 or more servings of vegetables each day. Try to fill one-half of your plate with fruits and vegetables. 6-8 servings of whole grains each day. Less than 6 oz (170 g) of lean meat, poultry, or fish each day. A 3-oz (85-g) serving of meat is about the same size as a deck of cards. One egg equals 1 oz (28 g). 2-3 servings of low-fat dairy each day. One serving is 1 cup (237 mL). 1 serving of nuts, seeds, or beans 5 times each week. 2-3 servings of heart-healthy fats. Healthy fats called omega-3 fatty acids are found in foods such as walnuts, flaxseeds, fortified milks, and eggs. These fats are also found in cold-water fish, such as sardines, salmon, and mackerel. Limit how much you eat of: Canned or prepackaged foods. Food that is high in trans fat, such as some fried foods. Food that is high in saturated fat, such as fatty meat. Desserts and other sweets, sugary drinks, and other foods with added sugar. Full-fat dairy  products. Do not salt foods before eating. Do not eat more than 4 egg yolks a week. Try to eat at least 2 vegetarian meals a week. Eat more home-cooked food and less restaurant, buffet, and fast food. Lifestyle When eating at a restaurant, ask that your food be prepared with less salt or no  salt, if possible. If you drink alcohol: Limit how much you use to: 0-1 drink a day for women who are not pregnant. 0-2 drinks a day for men. Be aware of how much alcohol is in your drink. In the [place] one drink equals one 12 oz bottle of beer (355 mL), one 5 oz glass of wine (148 mL), or one 1 oz glass of hard liquor (44 mL). General information Avoid eating more than 2,300 mg of salt a day. If you have hypertension, you may need to reduce your sodium intake to 1,500 mg a day. Work with your health care provider to maintain a healthy body weight or to lose weight. Ask what an ideal weight is for you. Get at least 30 minutes of exercise that causes your heart to beat faster (aerobic exercise) most days of the week. Activities may include walking, swimming, or biking. Work with your health care provider or dietitian to adjust your eating plan to your individual calorie needs. What foods should I eat? Fruits All fresh, dried, or frozen fruit. Canned fruit in natural juice (without added sugar). Vegetables Fresh or frozen vegetables (raw, steamed, roasted, or grilled). Low-sodium or reduced-sodium tomato and vegetable juice. Low-sodium or reduced-sodium tomato sauce and tomato paste. Low-sodium or reduced-sodium canned vegetables. Grains Whole-grain or whole-wheat bread. Whole-grain or whole-wheat pasta. Brown rice. [place]. Bulgur. Whole-grain and low-sodium cereals. Pita bread. Low-fat, low-sodium crackers. Whole-wheat flour tortillas. Meats and other proteins Skinless chicken or [place]. Ground chicken or [place]. Pork with fat trimmed off. Fish and seafood. Egg whites. Dried beans, peas, or lentils. Unsalted nuts, nut butters, and seeds. Unsalted canned beans. Lean cuts of beef with fat trimmed off. Low-sodium, lean precooked or cured meat, such as sausages or meat loaves. Dairy Low-fat (1%) or fat-free (skim) milk. Reduced-fat, low-fat, or fat-free cheeses. Nonfat, low-sodium ricotta or  cottage cheese. Low-fat or nonfat yogurt. Low-fat, low-sodium cheese. Fats and oils Soft margarine without trans fats. Vegetable oil. Reduced-fat, low-fat, or light mayonnaise and salad dressings (reduced-sodium). Canola, safflower, olive, avocado, soybean, and sunflower oils. Avocado. Seasonings and condiments Herbs. Spices. Seasoning mixes without salt. Other foods Unsalted popcorn and pretzels. Fat-free sweets. The items listed above may not be a complete list of foods and beverages you can eat. Contact a dietitian for more information. What foods should I avoid? Fruits Canned fruit in a light or heavy syrup. Fried fruit. Fruit in cream or butter sauce. Vegetables Creamed or fried vegetables. Vegetables in a cheese sauce. Regular canned vegetables (not low-sodium or reduced-sodium). Regular canned tomato sauce and paste (not low-sodium or reduced-sodium). Regular tomato and vegetable juice (not low-sodium or reduced-sodium). Angie Fava. Olives. Grains Baked goods made with fat, such as croissants, muffins, or some breads. Dry pasta or rice meal packs. Meats and other proteins Fatty cuts of meat. Ribs. Fried meat. Berniece Salines. Bologna, salami, and other precooked or cured meats, such as sausages or meat loaves. Fat from the back of a pig (fatback). Bratwurst. Salted nuts and seeds. Canned beans with added salt. Canned or smoked fish. Whole eggs or egg yolks. Chicken or [place] with skin. Dairy Whole or 2% milk, cream, and half-and-half. Whole or full-fat  cream cheese. Whole-fat or sweetened yogurt. Full-fat cheese. Nondairy creamers. Whipped toppings. Processed cheese and cheese spreads. Fats and oils Butter. Stick margarine. Lard. Shortening. Ghee. Bacon fat. Tropical oils, such as coconut, palm kernel, or palm oil. Seasonings and condiments Onion salt, garlic salt, seasoned salt, table salt, and sea salt. Worcestershire sauce. Tartar sauce. Barbecue sauce. Teriyaki sauce. Soy sauce, including  reduced-sodium. Steak sauce. Canned and packaged gravies. Fish sauce. Oyster sauce. Cocktail sauce. Store-bought horseradish. Ketchup. Mustard. Meat flavorings and tenderizers. Bouillon cubes. Hot sauces. Pre-made or packaged marinades. Pre-made or packaged taco seasonings. Relishes. Regular salad dressings. Other foods Salted popcorn and pretzels. The items listed above may not be a complete list of foods and beverages you should avoid. Contact a dietitian for more information. Where to find more information National Heart, Lung, and Blood Institute: https://wilson-eaton.com/ American Heart Association: www.heart.org Academy of Nutrition and Dietetics: www.eatright.[place]: www.kidney.org Summary The DASH eating plan is a healthy eating plan that has been shown to reduce high blood pressure (hypertension). It may also reduce your risk for type 2 diabetes, heart disease, and stroke. When on the DASH eating plan, aim to eat more fresh fruits and vegetables, whole grains, lean proteins, low-fat dairy, and heart-healthy fats. With the DASH eating plan, you should limit salt (sodium) intake to 2,300 mg a day. If you have hypertension, you may need to reduce your sodium intake to 1,500 mg a day. Work with your health care provider or dietitian to adjust your eating plan to your individual calorie needs. This information is not intended to replace advice given to you by your health care provider. Make sure you discuss any questions you have with your health care provider. Document Revised: 10/01/2019 Document Reviewed: 10/01/2019 Elsevier Patient Education  [place].  Systolic pressure: 0000000. Diastolic pressure: [id number]. Hypertension stage 2 Systolic pressure: [id number] or above. Diastolic pressure: 90 or above. How can this condition affect me? Managing your hypertension is very important. Over time, hypertension can damage the arteries and decrease blood flow to parts of the  body, including the brain, heart, and kidneys. Having untreated or uncontrolled hypertension can lead to: A heart attack. A stroke. A weakened blood vessel (aneurysm). Heart failure. Kidney damage. Eye damage. Memory and concentration problems. Vascular dementia. What actions can I take to manage this condition? Hypertension can be managed by making lifestyle changes and possibly by taking medicines. Your health care provider will help you make a plan to bring your blood pressure within a normal range. You may be referred for counseling on a healthy diet and physical activity. Nutrition  Eat a diet that is high in fiber and potassium, and low in salt (sodium), added sugar, and fat. An example eating plan is called the DASH diet. DASH stands for Dietary Approaches to Stop Hypertension. To eat this way: Eat plenty of fresh fruits and vegetables. Try to fill one-half of your plate at each meal with fruits and vegetables. Eat whole grains, such as whole-wheat pasta, brown rice, or whole-grain bread. Fill about one-fourth of your plate with whole grains. Eat low-fat dairy products. Avoid fatty cuts of meat, processed or cured meats, and poultry with skin. Fill about one-fourth of your plate with lean proteins such as fish, chicken without skin, beans, eggs, and tofu. Avoid pre-made and processed foods. These tend to be higher in sodium, added sugar, and fat. Reduce your daily sodium intake. Many people with hypertension should eat less than 1,500 mg of sodium a  day. Lifestyle  Work with your health care provider to maintain a healthy body weight or to lose weight. Ask what an ideal weight is for you. Get at least 30 minutes of exercise that causes your heart to beat faster (aerobic exercise) most days of the week. Activities may include walking, swimming, or biking. Include exercise to strengthen your muscles (resistance exercise), such as weight lifting, as part of your weekly exercise routine.  Try to do these types of exercises for 30 minutes at least 3 days a week. Do not use any products that contain nicotine or tobacco. These products include cigarettes, chewing tobacco, and vaping devices, such as e-cigarettes. If you need help quitting, ask your health care provider. Control any long-term (chronic) conditions you have, such as high cholesterol or diabetes. Identify your sources of stress and find ways to manage stress. This may include meditation, deep breathing, or making time for fun activities. Alcohol use Do not drink alcohol if: Your health care provider tells you not to drink. You are pregnant, may be pregnant, or are planning to become pregnant. If you drink alcohol: Limit how much you have to: 0-1 drink a day for women. 0-2 drinks a day for men. Know how much alcohol is in your drink. In the [place] one drink equals one 12 oz bottle of beer (355 mL), one 5 oz glass of wine (148 mL), or one 1 oz glass of hard liquor (44 mL). Medicines Your health care provider may prescribe medicine if lifestyle changes are not enough to get your blood pressure under control and if: Your systolic blood pressure is 130 or higher. Your diastolic blood pressure is 80 or higher. Take medicines only as told by your health care provider. Follow the directions carefully. Blood pressure medicines must be taken as told by your health care provider. The medicine does not work as well when you skip doses. Skipping doses also puts you at risk for problems. Monitoring Before you monitor your blood pressure: Do not smoke, drink caffeinated beverages, or exercise within 30 minutes before taking a measurement. Use the bathroom and empty your bladder (urinate). Sit quietly for at least 5 minutes before taking measurements. Monitor your blood pressure at home as told by your health care provider. To do this: Sit with your back straight and supported. Place your feet flat on the floor. Do not cross your  legs. Support your arm on a flat surface, such as a table. Make sure your upper arm is at heart level. Each time you measure, take two or three readings one minute apart and record the results. You may also need to have your blood pressure checked regularly by your health care provider. General information Talk with your health care provider about your diet, exercise habits, and other lifestyle factors that may be contributing to hypertension. Review all the medicines you take with your health care provider because there may be side effects or interactions. Keep all follow-up visits. Your health care provider can help you create and adjust your plan for managing your high blood pressure. Where to find more information National Heart, Lung, and Blood Institute: https://wilson-eaton.com/ American Heart Association: www.heart.org Contact a health care provider if: You think you are having a reaction to medicines you have taken. You have repeated (recurrent) headaches. You feel dizzy. You have swelling in your ankles. You have trouble with your vision. Get help right away if: You develop a severe headache or confusion. You have unusual weakness or numbness, or  you feel faint. You have severe pain in your chest or abdomen. You vomit repeatedly. You have trouble breathing. These symptoms may be an emergency. Get help right away. Call 911. Do not wait to see if the symptoms will go away. Do not drive yourself to the hospital. Summary Hypertension is when the force of blood pumping through your arteries is too strong. If this condition is not controlled, it may put you at risk for serious complications. Your personal target blood pressure may vary depending on your medical conditions, your age, and other factors. For most people, a normal blood pressure is less than 120/80. Hypertension is managed by lifestyle changes, medicines, or both. Lifestyle changes to help manage hypertension include losing  weight, eating a healthy, low-sodium diet, exercising more, stopping smoking, and limiting alcohol. This information is not intended to replace advice given to you by your health care provider. Make sure you discuss any questions you have with your health care provider. Document Revised: 07/12/2021 Document Reviewed: 07/12/2021 Elsevier Patient Education  [place].

## 2023-01-07 NOTE — Progress Notes (Signed)
Date:  01/07/2023   Name:  Carol Taylor   DOB:  [birth year]   MRN:  [id number]   Chief Complaint: Allergic Rhinitis , Hyperlipidemia, and Hypertension  Hyperlipidemia This is a chronic problem. The current episode started more than 1 year ago. The problem is controlled. Recent lipid tests were reviewed and are normal. She has no history of chronic renal disease or diabetes. There are no known factors aggravating her hyperlipidemia. Pertinent negatives include no chest pain, focal sensory loss, focal weakness, leg pain, myalgias or shortness of breath. Current antihyperlipidemic treatment includes diet change. The current treatment provides moderate improvement of lipids. There are no compliance problems.  Risk factors for coronary artery disease include dyslipidemia and hypertension.  Hypertension This is a chronic problem. The current episode started more than 1 year ago. The problem has been waxing and waning since onset. The problem is uncontrolled. Pertinent negatives include no chest pain, palpitations or shortness of breath. There are no associated agents to hypertension. Risk factors for coronary artery disease include diabetes mellitus. Past treatments include angiotensin blockers (stopped metoprolol). The current treatment provides moderate improvement. There are no compliance problems.  There is no history of angina, kidney disease, CAD/MI, CVA, heart failure, left ventricular hypertrophy, PVD or retinopathy. There is no history of chronic renal disease, a hypertension causing med or renovascular disease.    Lab Results  Component Value Date   NA 140 06/20/2022   K 4.0 06/20/2022   CO2 22 06/20/2022   GLUCOSE 97 06/20/2022   BUN 7 (L) 06/20/2022   CREATININE 0.65 06/20/2022   CALCIUM 9.2 06/20/2022   EGFR 99 06/20/2022   GFRNONAA 93 03/22/2020   Lab Results  Component Value Date   CHOL 199 06/20/2022   HDL 45 06/20/2022   LDLCALC 126 (H) 06/20/2022   TRIG 158 (H)  06/20/2022   CHOLHDL 5.0 (H) 03/25/2019   No results found for: "TSH" No results found for: "HGBA1C" No results found for: "WBC", "HGB", "HCT", "MCV", "PLT" Lab Results  Component Value Date   ALT 12 11/07/2021   AST 18 11/07/2021   ALKPHOS 95 11/07/2021   BILITOT <0.2 11/07/2021   No results found for: "25OHVITD2", "25OHVITD3", "VD25OH"   Review of Systems  HENT:  Negative for trouble swallowing.   Eyes:  Negative for visual disturbance.  Respiratory:  Negative for cough, chest tightness, shortness of breath and wheezing.   Cardiovascular:  Negative for chest pain, palpitations and leg swelling.  Gastrointestinal:  Negative for abdominal pain and blood in stool.  Endocrine: Negative for polydipsia and polyuria.  Genitourinary:  Negative for difficulty urinating and menstrual problem.  Musculoskeletal:  Negative for myalgias.  Neurological:  Negative for focal weakness.    Patient Active Problem List   Diagnosis Date Noted   Pure hypercholesterolemia 06/13/2017   Chronic seasonal allergic rhinitis due to pollen 10/01/2016   Essential hypertension 10/18/2015   Familial multiple lipoprotein-type hyperlipidemia 04/14/2015   Routine general medical examination at a health care facility 04/14/2015   H/O: HTN (hypertension) 04/14/2015   Cigarette nicotine dependence without complication 0000000   Overweight (BMI 25.0-29.9) 04/14/2015    Allergies  Allergen Reactions   Sulfa Antibiotics Hives    Past Surgical History:  Procedure Laterality Date   TUBAL LIGATION      Social History   Tobacco Use   Smoking status: Every Day    Types: Cigarettes   Smokeless tobacco: Never   Tobacco comments:    patches  and pills   Vaping Use   Vaping Use: Never used  Substance Use Topics   Alcohol use: No    Alcohol/week: 0.0 standard drinks of alcohol   Drug use: No     Medication list has been reviewed and updated.  Current Meds  Medication Sig   cholecalciferol  (VITAMIN D3) 25 MCG (1000 UNIT) tablet Take 2,000 Units by mouth daily.   loratadine (CLARITIN) 10 MG tablet TAKE 1 TABLET(10 MG) BY MOUTH DAILY   losartan (COZAAR) 100 MG tablet TAKE 1/2 TABLET BY MOUTH DAILY   Multiple Vitamin (MULTIVITAMIN) tablet Take 1 tablet by mouth daily.   sodium chloride (OCEAN) 0.65 % SOLN nasal spray Place 1 spray into both nostrils as needed for congestion.   vitamin E 180 MG (400 UNITS) capsule Take 400 Units by mouth daily.       01/07/2023    8:38 AM 06/20/2022    9:02 AM 11/07/2021    8:27 AM 03/27/2021    9:20 AM  GAD 7 : Generalized Anxiety Score  Nervous, Anxious, on Edge 0 0 0 0  Control/stop worrying 0 0 0 0  Worry too much - different things 0 0 0 0  Trouble relaxing 0 0 0 0  Restless 0 0 0 0  Easily annoyed or irritable 0 0 0 0  Afraid - awful might happen 0 0 0 0  Total GAD 7 Score 0 0 0 0  Anxiety Difficulty Not difficult at all Not difficult at all Not difficult at all        01/07/2023    8:38 AM 06/20/2022    9:02 AM 11/07/2021    8:27 AM  Depression screen PHQ 2/9  Decreased Interest 0 0 0  Down, Depressed, Hopeless 0 0 0  PHQ - 2 Score 0 0 0  Altered sleeping 0 0 0  Tired, decreased energy 0 0 0  Change in appetite 0 0 0  Feeling bad or failure about yourself  0 0 0  Trouble concentrating 0 0 0  Moving slowly or fidgety/restless 0 0 0  Suicidal thoughts 0 0 0  PHQ-9 Score 0 0 0  Difficult doing work/chores Not difficult at all Not difficult at all Not difficult at all    BP Readings from Last 3 Encounters:  01/07/23 136/88  06/20/22 130/80  11/07/21 130/80    Physical Exam Vitals and nursing note reviewed. Exam conducted with a chaperone present.  Constitutional:      General: She is not in acute distress.    Appearance: She is not diaphoretic.  HENT:     Head: Normocephalic and atraumatic.     Right Ear: Tympanic membrane and external ear normal.     Left Ear: Tympanic membrane and external ear normal.     Nose:  Nose normal.     Mouth/Throat:     Mouth: Mucous membranes are moist.  Eyes:     General:        Right eye: No discharge.        Left eye: No discharge.     Conjunctiva/sclera: Conjunctivae normal.     Pupils: Pupils are equal, round, and reactive to light.  Neck:     Thyroid: No thyromegaly.     Vascular: No JVD.  Cardiovascular:     Rate and Rhythm: Normal rate and regular rhythm.     Heart sounds: Normal heart sounds. No murmur heard.    No friction rub. No gallop.  Pulmonary:  Effort: Pulmonary effort is normal.     Breath sounds: Normal breath sounds. No wheezing, rhonchi or rales.  Abdominal:     General: Bowel sounds are normal.     Palpations: Abdomen is soft. There is no mass.     Tenderness: There is no abdominal tenderness. There is no guarding.  Musculoskeletal:        General: Normal range of motion.     Cervical back: Normal range of motion and neck supple.  Lymphadenopathy:     Cervical: No cervical adenopathy.  Skin:    General: Skin is warm and dry.  Neurological:     Mental Status: She is alert.     Deep Tendon Reflexes: Reflexes are normal and symmetric.     Wt Readings from Last 3 Encounters:  01/07/23 153 lb (69.4 kg)  06/20/22 152 lb (68.9 kg)  11/07/21 157 lb (71.2 kg)    BP 136/88 (BP Location: Right Arm, Cuff Size: Large)   Pulse 86   Ht '5\' 2"'$  (1.575 m)   Wt 153 lb (69.4 kg)   SpO2 95%   BMI 27.98 kg/m   Assessment and Plan:  1. Familial multiple lipoprotein-type hyperlipidemia Chronic.  Uncontrolled.  Stable.  Patient was unable to tolerate CDM and stopped it on her own.  We will check lipid panel in the meantime I have reemphasized the dietary approach to watch for cholesterol and triglycerides. - Lipid Panel With LDL/HDL Ratio  2. Chronic seasonal allergic rhinitis due to pollen Chronic.  Controlled.  Stable.  Continue loratadine 10 mg once a day. - loratadine (CLARITIN) 10 MG tablet; TAKE 1 TABLET(10 MG) BY MOUTH DAILY   Dispense: 90 tablet; Refill: 1  3. Essential hypertension Chronic.  Uncontrolled.  Stable.  Patient did self research and determined to stop her metoprolol.  Current she she is on losartan 50 mg once a day and I have discussed this with her and she has agreed to initiate hydrochlorothiazide 12.5 mg once a day.  We will recheck blood pressure in 6 to 8 weeks - losartan (COZAAR) 100 MG tablet; TAKE 1/2 TABLET BY MOUTH DAILY  Dispense: 45 tablet; Refill: 1    Otilio Miu, MD

## 2023-01-08 LAB — LIPID PANEL WITH LDL/HDL RATIO
Cholesterol, Total: 241 mg/dL — ABNORMAL HIGH (ref 100–199)
HDL: 45 mg/dL (ref >39)
LDL Chol Calc (NIH): 157 mg/dL — ABNORMAL HIGH (ref 0–99)
LDL/HDL Ratio: 3.5 ratio — ABNORMAL HIGH (ref 0.0–3.2)
Triglycerides: 210 mg/dL — ABNORMAL HIGH (ref 0–149)
VLDL Cholesterol Cal: 39 mg/dL (ref 5–40)

## 2023-01-08 NOTE — Progress Notes (Signed)
Letter printed [email or web address] mailed to patient.  Carol Taylor

## 2023-03-04 ENCOUNTER — Ambulatory Visit: Payer: BC Managed Care – PPO | Admitting: Family Medicine

## 2023-07-08 ENCOUNTER — Ambulatory Visit: Payer: BC Managed Care – PPO | Admitting: Family Medicine

## 2023-07-24 DIAGNOSIS — H524 Presbyopia: Secondary | ICD-10-CM | POA: Diagnosis not present
